# Patient Record
Sex: Male | Born: 1985 | Race: White | Hispanic: No | Marital: Married | Smoking: Never smoker
Health system: Southern US, Community
[De-identification: ages and names within clinical notes are randomized; demographics above are authoritative.]

## PROBLEM LIST (undated history)

## (undated) HISTORY — PX: OTHER SURGICAL HISTORY: SHX169

---

## 2012-07-03 ENCOUNTER — Ambulatory Visit (INDEPENDENT_AMBULATORY_CARE_PROVIDER_SITE_OTHER): Payer: BC Managed Care – PPO | Admitting: Family Medicine

## 2012-07-03 ENCOUNTER — Encounter: Payer: Self-pay | Admitting: Family Medicine

## 2012-07-03 VITALS — BP 124/86 | Temp 97.5°F | Wt 233.8 lb

## 2012-07-03 DIAGNOSIS — L723 Sebaceous cyst: Secondary | ICD-10-CM

## 2012-07-03 MED ORDER — DOXYCYCLINE HYCLATE 100 MG PO TABS: 100.0000 mg | ORAL_TABLET | Freq: Two times a day (BID) | ORAL | Status: DC

## 2012-07-03 NOTE — Patient Instructions (Signed)
Take all the antibiotics 

## 2012-07-05 NOTE — Progress Notes (Signed)
  Subjective:    Patient ID: Miguel Bowman., male    DOB: 1985/10/11, 27 y.o.   MRN: [id number]  HPI Patient presents the office with a bump on the back of his neck. It is concerning him. It has been going on for some time. Probably months. Recently it has started to swell more. His fiance is worried about it. No fever. Had a similar bump, up and was able to express some discharge out of it. A number of months ago.   Review of Systems    ROS no fever no rash no chills no swelling elsewhere otherwise negative Objective:   Physical Exam  Alert no acute distress. Vitals reviewed. Lungs clear. Heart regular rate and rhythm. HEENT normal. Upper posterior cervical region swollen palpable sebaceous cyst. No fluctuance. No erythema. Slightly tender to palpation. Similar bump nearby. But smaller.      Assessment & Plan:  Impression #1 sebaceous cyst with secondary infection-discussed. Natural history of sebaceous cysts discussed. 15 minutes spent most in discussion. Plan Doxy 100 twice a day 10 days. Warning signs discussed. WSL

## 2012-10-19 ENCOUNTER — Encounter: Payer: Self-pay | Admitting: Family Medicine

## 2012-10-19 ENCOUNTER — Ambulatory Visit (INDEPENDENT_AMBULATORY_CARE_PROVIDER_SITE_OTHER): Payer: BC Managed Care – PPO | Admitting: Family Medicine

## 2012-10-19 VITALS — BP 130/86 | Ht 71.0 in | Wt 239.4 lb

## 2012-10-19 DIAGNOSIS — R079 Chest pain, unspecified: Secondary | ICD-10-CM

## 2012-10-19 NOTE — Progress Notes (Signed)
  Subjective:    Patient ID: Miguel Shove., male    DOB: 1985/02/24, 27 y.o.   MRN: [id number]  Chest Pain  This is a new problem. The current episode started in the past 7 days. The onset quality is sudden. The problem occurs intermittently. The problem has been unchanged. The pain is at a severity of 4/10. The pain is mild. The quality of the pain is described as sharp and heavy. The pain radiates to the epigastrium.   Sudden sharp pain, took a couple impressive  Patient concerned about the chest pain. There is some family history of heart disease.  Sharp one shot painsharp  No sig chamge from before  Movement exrcises, no pain with motion  Felt out of breath.  Tumbles rolls etc. Felt a sense of panic, recurred several times   Review of Systems  Cardiovascular: Positive for chest pain.   no abdominal pain no change in bowel habits ROS otherwise negative     Objective:   Physical Exam Alert no acute distress. HEENT normal. Lungs clear. Heart regular rate and rhythm. Chest wall nontender. Abdomen benign.  EKG normal sinus rhythm no significant ST-T changes partial right bundle-branch block which does not contribute to patient's symptoms       Assessment & Plan:  Impression chest pain highly likely chest wall. On further history doing a lot of new exercise prior to this occurrence. Pain is sharp lasts just a few seconds. Normal exam and normal EKG extremely unlikely that this is apart. Discussed at length. Plan 25 minutes spent most in discussion. Aleve twice a day with food when necessary. Continue exercising. WSL

## 2013-02-08 ENCOUNTER — Ambulatory Visit (INDEPENDENT_AMBULATORY_CARE_PROVIDER_SITE_OTHER): Payer: BC Managed Care – PPO | Admitting: Family Medicine

## 2013-02-08 ENCOUNTER — Encounter: Payer: Self-pay | Admitting: Family Medicine

## 2013-02-08 VITALS — BP 132/70 | Temp 98.4°F | Ht 70.0 in | Wt 231.0 lb

## 2013-02-08 DIAGNOSIS — J019 Acute sinusitis, unspecified: Secondary | ICD-10-CM

## 2013-02-08 MED ORDER — LEVOFLOXACIN 500 MG PO TABS: 500.0000 mg | ORAL_TABLET | Freq: Every day | ORAL | Status: AC

## 2013-02-08 NOTE — Progress Notes (Signed)
   Subjective:    Patient ID: Miguel Bowman., male    DOB: 10/31/1985, 28 y.o.   MRN: [id number]  Sore Throat  This is a new problem. The current episode started 1 to 4 weeks ago. Associated symptoms include trouble swallowing. Pertinent negatives include no congestion, coughing or ear pain. Associated symptoms comments: Runny nose.   Started since a few weeks ago PMH benign Some sweats, no chills No cough, some sinus Sx pmh-benign Review of Systems  Constitutional: Negative for fever and activity change.  HENT: Positive for sore throat and trouble swallowing. Negative for congestion, ear pain and rhinorrhea.   Eyes: Negative for discharge.  Respiratory: Negative for cough, choking and wheezing.   Cardiovascular: Negative for chest pain.       Objective:   Physical Exam  Nursing note and vitals reviewed. Constitutional: He appears well-developed.  HENT:  Head: Normocephalic.  Mouth/Throat: Oropharynx is clear and moist. No oropharyngeal exudate.  Neck: Normal range of motion.  Cardiovascular: Normal rate, regular rhythm and normal heart sounds.   No murmur heard. Pulmonary/Chest: Effort normal and breath sounds normal. He has no wheezes.  Lymphadenopathy:    He has no cervical adenopathy.  Neurological: He exhibits normal muscle tone.  Skin: Skin is warm and dry.          Assessment & Plan:  Crestor illness with secondary sinusitis Levaquin 10 days as directed followup if progressive troubles warning signs discussed

## 2013-08-10 ENCOUNTER — Encounter: Payer: Self-pay | Admitting: Family Medicine

## 2013-08-10 ENCOUNTER — Ambulatory Visit (INDEPENDENT_AMBULATORY_CARE_PROVIDER_SITE_OTHER): Payer: BC Managed Care – PPO | Admitting: Family Medicine

## 2013-08-10 VITALS — BP 112/80 | Temp 97.9°F | Ht 70.0 in | Wt 233.4 lb

## 2013-08-10 DIAGNOSIS — R109 Unspecified abdominal pain: Secondary | ICD-10-CM

## 2013-08-10 LAB — POCT URINALYSIS DIPSTICK
Spec Grav, UA: 1.02
pH, UA: 5

## 2013-08-10 NOTE — Progress Notes (Signed)
   Subjective:    Patient ID: Miguel ShoveGeorge T Osso Jr., male    DOB: 08/11/1985, 28 y.o.   MRN: [id number]  Abdominal Pain This is a new problem. The current episode started yesterday. The onset quality is sudden. The problem occurs intermittently. The problem has been gradually worsening. The pain is located in the generalized abdominal region. The pain is moderate. The quality of the pain is cramping and dull. The abdominal pain radiates to the right flank. Associated symptoms include diarrhea and headaches. Nothing aggravates the pain. The pain is relieved by nothing. He has tried acetaminophen for the symptoms. The treatment provided no relief.  started last night, began with central abd cramps Then radiated to the right side Then drank a glass of water because felt dehydrated Light yellow urine no hematuria Had diarrhea times 2 this am, very loose Aching in the right flank Moderate nausea, ? Fever this am, felt chilled Not around any intestinal sickness Not sure of kidney stone history No recent travel or undercooked food Patient states he has no other concerns at this time.    Review of Systems  Gastrointestinal: Positive for abdominal pain and diarrhea.  Neurological: Positive for headaches.       Objective:   Physical Exam  Vitals reviewed. Constitutional: He appears well-nourished. No distress.  Cardiovascular: Normal rate, regular rhythm and normal heart sounds.   No murmur heard. Pulmonary/Chest: Effort normal and breath sounds normal. No respiratory distress.  Abdominal: Soft. He exhibits no distension. There is no tenderness.  There is minimal tenderness in the right mid to right lower quadrant no guarding or rebound  Musculoskeletal: He exhibits no edema.  Lymphadenopathy:    He has no cervical adenopathy.  Neurological: He is alert.  Psychiatric: His behavior is normal.          Assessment & Plan:  His urine is negative tenderness is minimal I doubt  appendicitis. We talked at length about the importance of if his symptoms progress for him to notify us immediately to do further evaluation including the possibility of lab work and scan. More than likely this is viral process I doubt kidney stone

## 2013-09-22 ENCOUNTER — Telehealth: Payer: Self-pay | Admitting: Family Medicine

## 2013-09-22 NOTE — Telephone Encounter (Signed)
Notified patient that according to his chart he has not received the TDAP vaccine. Patient was told to check with his insurance to make sure the vaccine is covered. Patient stated that even if his insurance doesn't cover it, he will pay cash price for it. Patient was transferred to front desk to schedule appointment.

## 2013-09-22 NOTE — Telephone Encounter (Signed)
Mr Miguel Bowman wants to know if he has had a Tdap shot.  Family member has a new baby and they can't be near it if they have not been vaccinated. Please call and let him know if he has had one or needs to come in for one. bb

## 2013-09-23 ENCOUNTER — Ambulatory Visit (INDEPENDENT_AMBULATORY_CARE_PROVIDER_SITE_OTHER): Payer: BC Managed Care – PPO | Admitting: *Deleted

## 2013-09-23 DIAGNOSIS — Z23 Encounter for immunization: Secondary | ICD-10-CM

## 2014-03-21 ENCOUNTER — Encounter: Payer: BC Managed Care – PPO | Admitting: Family Medicine

## 2014-03-29 ENCOUNTER — Ambulatory Visit (INDEPENDENT_AMBULATORY_CARE_PROVIDER_SITE_OTHER): Payer: BC Managed Care – PPO | Admitting: Family Medicine

## 2014-03-29 ENCOUNTER — Encounter: Payer: Self-pay | Admitting: Family Medicine

## 2014-03-29 VITALS — BP 122/82 | Ht 70.0 in | Wt 238.8 lb

## 2014-03-29 DIAGNOSIS — Z0000 Encounter for general adult medical examination without abnormal findings: Secondary | ICD-10-CM

## 2014-03-29 DIAGNOSIS — Z79899 Other long term (current) drug therapy: Secondary | ICD-10-CM

## 2014-03-29 LAB — LIPID PANEL
CHOLESTEROL: 185 mg/dL (ref 0–200)
HDL: 46 mg/dL (ref >=40)
LDL Cholesterol: 106 mg/dL — ABNORMAL HIGH (ref 0–99)
TRIGLYCERIDES: 165 mg/dL — AB (ref <150)
Total CHOL/HDL Ratio: 4 Ratio
VLDL: 33 mg/dL (ref 0–40)

## 2014-03-29 LAB — HEPATIC FUNCTION PANEL
ALBUMIN: 4.7 g/dL (ref 3.5–5.2)
ALT: 21 U/L (ref 0–53)
AST: 18 U/L (ref 0–37)
Alkaline Phosphatase: 56 U/L (ref 39–117)
BILIRUBIN INDIRECT: 0.5 mg/dL (ref 0.2–1.2)
Bilirubin, Direct: 0.1 mg/dL (ref 0.0–0.3)
Total Bilirubin: 0.6 mg/dL (ref 0.2–1.2)
Total Protein: 7.4 g/dL (ref 6.0–8.3)

## 2014-03-29 LAB — BASIC METABOLIC PANEL
BUN: 17 mg/dL (ref 6–23)
CHLORIDE: 106 meq/L (ref 96–112)
CO2: 29 meq/L (ref 19–32)
CREATININE: 0.98 mg/dL (ref 0.50–1.35)
Calcium: 9.6 mg/dL (ref 8.4–10.5)
GLUCOSE: 95 mg/dL (ref 70–99)
POTASSIUM: 4.5 meq/L (ref 3.5–5.3)
Sodium: 143 mEq/L (ref 135–145)

## 2014-03-29 MED ORDER — TERBINAFINE HCL 250 MG PO TABS: 250.0000 mg | ORAL_TABLET | Freq: Every day | ORAL | Status: DC

## 2014-03-29 NOTE — Progress Notes (Signed)
   Subjective:    Patient ID: Miguel ShoveGeorge T Castagna Jr., male    DOB: 01/13/1986, 29 y.o.   MRN: [id number]  HPI Patient arrives for annual PE.  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   Tremendous pace with work day and eve  Very hectic pace  Exercise not the best. 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  Sleeping well at night  Patient also has nail fungus on both feet he would like to discuss treatment.   Review of Systems  Constitutional: Negative for fever, activity change and appetite change.  HENT: Negative for congestion and rhinorrhea.   Eyes: Negative for discharge.  Respiratory: Negative for cough and wheezing.   Cardiovascular: Negative for chest pain.  Gastrointestinal: Negative for vomiting, abdominal pain and blood in stool.  Genitourinary: Negative for frequency and difficulty urinating.  Musculoskeletal: Negative for neck pain.  Skin: Negative for rash.  Allergic/Immunologic: Negative for environmental allergies and food allergies.  Neurological: Negative for weakness and headaches.  Psychiatric/Behavioral: Negative for agitation.  All other systems reviewed and are negative.      Objective:   Physical Exam  Constitutional: He appears well-developed and well-nourished.  HENT:  Head: Normocephalic and atraumatic.  Right Ear: External ear normal.  Left Ear: External ear normal.  Nose: Nose normal.  Mouth/Throat: Oropharynx is clear and moist.  Eyes: EOM are normal. Pupils are equal, round, and reactive to light.  Neck: Normal range of motion. Neck supple. No thyromegaly present.  Cardiovascular: Normal rate, regular rhythm and normal heart sounds.   No murmur heard. Pulmonary/Chest: Effort normal and breath sounds normal. No respiratory distress. He has no wheezes.  Abdominal: Soft. Bowel sounds are normal. He exhibits no distension and no mass. There is no tenderness.  Genitourinary: Penis normal.    Musculoskeletal: Normal range of motion. He exhibits no edema.  Lymphadenopathy:    He has no cervical adenopathy.  Neurological: He is alert. He exhibits normal muscle tone.  Skin: Skin is warm and dry. No erythema.  Very severe involvement of virtually all nails with onychomycosis. Also tenia pedis. Also tenia cruris. KOH positive for fungal elements  Psychiatric: He has a normal mood and affect. His behavior is normal. Judgment normal.          Assessment & Plan:  Impression #1 wellness exam #2 onychomycosis discussed definitely needs to be treated with severity #3 obesity discussed plan appropriate blood work. If liver enzymes negative did initiate Lamisil. Check liver enzymes 6 weeks and. Diet exercise discussed further recommendations based on blood work [facility]

## 2014-03-29 NOTE — Patient Instructions (Signed)
Do not start Lamisil till you hear results of lab work. Once you start the med you will need to have blood work repeated in 6 weeks.

## 2014-03-31 ENCOUNTER — Encounter: Payer: Self-pay | Admitting: Family Medicine

## 2014-04-26 ENCOUNTER — Encounter: Payer: Self-pay | Admitting: *Deleted

## 2016-01-02 ENCOUNTER — Ambulatory Visit (INDEPENDENT_AMBULATORY_CARE_PROVIDER_SITE_OTHER): Payer: BC Managed Care – PPO | Admitting: Family Medicine

## 2016-01-02 ENCOUNTER — Encounter: Payer: Self-pay | Admitting: Family Medicine

## 2016-01-02 DIAGNOSIS — K580 Irritable bowel syndrome with diarrhea: Secondary | ICD-10-CM | POA: Diagnosis not present

## 2016-01-02 MED ORDER — HYOSCYAMINE SULFATE ER 0.375 MG PO TB12: 0.3750 mg | ORAL_TABLET | Freq: Two times a day (BID) | ORAL | 2 refills | Status: DC

## 2016-01-02 NOTE — Progress Notes (Signed)
   Subjective:    Patient ID: Miguel ShoveGeorge T Ridge Jr., male    DOB: 04/16/1985, 30 y.o.   MRN: [id number]  Abdominal Pain  This is a new problem. The current episode started more than 1 month ago. The problem occurs intermittently. The problem has been unchanged. The pain is located in the generalized abdominal region. The quality of the pain is aching. Associated symptoms include diarrhea. Nothing aggravates the pain. The pain is relieved by nothing. Treatments tried: otc meds. The treatment provided mild relief.   Patient states that he has no other concerns at this time.   Mid October Developed stomach symtoms after eating old cake  Had diarrhea, loose stools for awhile Start of nov had white  bm's   Now loose stools again   Has tried low carb diet, lost eight pounds on a low carb diet  Worse now  Pain     No longer getting better, Ongoing loose stools. Ongoing cramping. Admits to considerable increased stress. Recently had a baby. Though he and his wife are of course happy about this a lot of stress at home. 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 Next  Strong family history of IBS this is discussed at length.  Review of Systems  Gastrointestinal: Positive for abdominal pain and diarrhea.       Objective:   Physical Exam  Alert vitals stable, NAD. Blood pressure good on repeat. HEENT normal. Lungs clear. Heart regular rate and rhythm. Abdomen excellent bowel sounds no discrete tenderness no organomegaly no rebound no guarding very mild diffuse sensitivity      Assessment & Plan:  Impression probable IBS discussed at great length highly doubt anything serious such as cancer , inflammatory bowel disease, protracted infection etc. plan educational information given. Multiple questions answered. Increase fiber intake. Start left did one twice per day. Await response warning signs discussed 25 minutes spent most in discussion

## 2016-01-02 NOTE — Patient Instructions (Signed)
Irritable Bowel Syndrome, Adult Irritable bowel syndrome (IBS) is not one specific disease. It is a group of symptoms that affects the organs responsible for digestion (gastrointestinal or GI tract). To regulate how your GI tract works, your body sends signals back and forth between your intestines and your brain. If you have IBS, there may be a problem with these signals. As a result, your GI tract does not function normally. Your intestines may become more sensitive and overreact to certain things. This is especially true when you eat certain foods or when you are under stress. There are four types of IBS. These may be determined based on the consistency of your stool:  IBS with diarrhea.  IBS with constipation.  Mixed IBS.  Unsubtyped IBS. It is important to know which type of IBS you have. Some treatments are more likely to be helpful for certain types of IBS. What are the causes? The exact cause of IBS is not known. What increases the risk? You may have a higher risk of IBS if:  You are a woman.  You are younger than 30 years old.  You have a family history of IBS.  You have mental health problems.  You have had bacterial infection of your GI tract. What are the signs or symptoms? Symptoms of IBS vary from person to person. The main symptom is abdominal pain or discomfort. Additional symptoms usually include one or more of the following:  Diarrhea, constipation, or both.  Abdominal swelling or bloating.  Feeling full or sick after eating a small or regular-size meal.  Frequent gas.  Mucus in the stool.  A feeling of having more stool left after a bowel movement. Symptoms tend to come and go. They may be associated with stress, psychiatric conditions, or nothing at all. How is this diagnosed? There is no specific test to diagnose IBS. Your health care provider will make a diagnosis based on a physical exam, medical history, and your symptoms. You may have other tests to  rule out other conditions that may be causing your symptoms. These may include:  Blood tests.  X-rays.  CT scan.  Endoscopy and colonoscopy. This is a test in which your GI tract is viewed with a long, thin, flexible tube. How is this treated? There is no cure for IBS, but treatment can help relieve symptoms. IBS treatment often includes:  Changes to your diet, such as:  Eating more fiber.  Avoiding foods that cause symptoms.  Drinking more water.  Eating regular, medium-sized portioned meals.  Medicines. These may include:  Fiber supplements if you have constipation.  Medicine to control diarrhea (antidiarrheal medicines).  Medicine to help control muscle spasms in your GI tract (antispasmodic medicines).  Medicines to help with any mental health issues, such as antidepressants or tranquilizers.  Therapy.  Talk therapy may help with anxiety, depression, or other mental health issues that can make IBS symptoms worse.  Stress reduction.  Managing your stress can help keep symptoms under control. Follow these instructions at home:  Take medicines only as directed by your health care provider.  Eat a healthy diet.  Avoid foods and drinks with added sugar.  Include more whole grains, fruits, and vegetables gradually into your diet. This may be especially helpful if you have IBS with constipation.  Avoid any foods and drinks that make your symptoms worse. These may include dairy products and caffeinated or carbonated drinks.  Do not eat large meals.  Drink enough fluid to keep your urine   clear or pale yellow.  Exercise regularly. Ask your health care provider for recommendations of good activities for you.  Keep all follow-up visits as directed by your health care provider. This is important. Contact a health care provider if:  You have constant pain.  You have trouble or pain with swallowing.  You have worsening diarrhea. Get help right away if:  You  have severe and worsening abdominal pain.  You have diarrhea and:  You have a rash, stiff neck, or severe headache.  You are irritable, sleepy, or difficult to awaken.  You are weak, dizzy, or extremely thirsty.  You have bright red blood in your stool or you have black tarry stools.  You have unusual abdominal swelling that is painful.  You vomit continuously.  You vomit blood (hematemesis).  You have both abdominal pain and a fever. This information is not intended to replace advice given to you by your health care provider. Make sure you discuss any questions you have with your health care provider. Document Released: 01/21/2005 Document Revised: 06/23/2015 Document Reviewed: 10/08/2013 Elsevier Interactive Patient Education  2017 Else[employer] Inc.  

## 2016-01-03 DIAGNOSIS — K589 Irritable bowel syndrome without diarrhea: Secondary | ICD-10-CM | POA: Insufficient documentation

## 2016-02-08 ENCOUNTER — Telehealth: Payer: Self-pay | Admitting: Family Medicine

## 2016-02-08 ENCOUNTER — Encounter: Payer: Self-pay | Admitting: Family Medicine

## 2016-02-08 DIAGNOSIS — K580 Irritable bowel syndrome with diarrhea: Secondary | ICD-10-CM

## 2016-02-08 NOTE — Telephone Encounter (Signed)
Referral entered  

## 2016-02-08 NOTE — Telephone Encounter (Signed)
Let's do 

## 2016-02-08 NOTE — Telephone Encounter (Signed)
Been on meds for the IBS since 01/03/16, can't tell it's helping very much Helped some with the cramping but not actually going  Would like referral to Dr. Luvenia Starchourk's office   Please advise

## 2016-02-14 ENCOUNTER — Encounter: Payer: Self-pay | Admitting: Internal Medicine

## 2016-03-07 ENCOUNTER — Encounter: Payer: Self-pay | Admitting: Gastroenterology

## 2016-03-07 ENCOUNTER — Ambulatory Visit (INDEPENDENT_AMBULATORY_CARE_PROVIDER_SITE_OTHER): Payer: BC Managed Care – PPO | Admitting: Gastroenterology

## 2016-03-07 DIAGNOSIS — R194 Change in bowel habit: Secondary | ICD-10-CM | POA: Insufficient documentation

## 2016-03-07 NOTE — Progress Notes (Signed)
Primary Care Physician:  Lubertha SouthSteve Luking, MD  Primary Gastroenterologist:  Roetta SessionsMichael Rourk, MD   Chief Complaint  Patient presents with  . Constipation  . Diarrhea    with white discharge    HPI:  Miguel ShoveGeorge T Luepke Jr. is a 31 y.o. male here to request a PCP for irritable bowel syndrome with diarrhea. Patient notes he started having a bowel habit changes around October of last year. He had a baby in September, first child. 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 Previously states his bowel habits are pretty normal. He had 1 stool a day. Rarely had any issues with urgency only once or twice before. In October he started having a lot of abdominal cramping and urgency. He thought might be because of dietary changes, walls wife is pregnant and they were both on low-carb diet. After the baby was born he went back to her regular diet. He therefore try low-carb diet again however was no significant improvement in his bowel issues.  Patient saw PCP. He was given trial of Levbid which did help with cramping but did not help with stool consistency. Patient states he never tried fiber as suggested. He went and bought MiraLAX and noted improvement in his stool consistency, more regularity and less mucus discharge noted. Had reported lots of white discharge almost more than stool at one point. Denies rectal pain. No upper GI symptoms. Notes that if he stops MiraLAX and he starts having more issues with urgency abdominal cramping and mucousy discharge. Denies bright red blood per rectum or melena. Patient reports starting MiraLAX because he was having some hard stool, now mostly [place] 4-5.      Current Outpatient Prescriptions  Medication Sig Dispense Refill  . polyethylene glycol (MIRALAX / GLYCOLAX) packet Take 17 g by mouth daily.     No current facility-administered medications for  this visit.     Allergies as of 03/07/2016 - Review Complete 03/07/2016  Allergen Reaction Noted  . Ceclor [cefaclor]  04/26/2014    History reviewed. No pertinent past medical history.  Past Surgical History:  Procedure Laterality Date  . none      Family History  Problem Relation Age of Onset  . Colon cancer Neg Hx   . Inflammatory bowel disease Neg Hx   . Celiac disease Neg Hx     Social History   Social History  . Marital status: Married    Spouse name: N/A  . Number of children: N/A  . Years of education: N/A   Occupational History  . 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 Smoking status: Never Smoker  . Smokeless tobacco: Never Used  . Alcohol use No  . Drug use: No  . Sexual activity: Not on file   Other Topics Concern  . Not on file   Social History Narrative  . No narrative on file      ROS:  General: Negative for anorexia, weight loss, fever, chills, fatigue, weakness. Eyes: Negative for vision changes.  ENT: Negative for hoarseness, difficulty swallowing , nasal congestion. CV: Negative for chest pain, angina, palpitations, dyspnea on exertion, peripheral edema.  Respiratory: Negative for dyspnea at rest, dyspnea on exertion, cough, sputum, wheezing.  GI: See history of present illness. GU:  Negative for dysuria, hematuria, urinary incontinence,  urinary frequency, nocturnal urination.  MS: Negative for joint pain, low back pain.  Derm: Negative for rash or itching.  Neuro: Negative for weakness, abnormal sensation, seizure, frequent headaches, memory loss, confusion.  Psych: Negative for anxiety, depression, suicidal ideation, hallucinations.  Endo: Negative for unusual weight change.  Heme: Negative for bruising or bleeding. Allergy: Negative for rash or hives.    Physical Examination:  BP 140/76   Pulse 82   Temp 97.6 F (36.4 C) (Oral)   Ht 5' 10.5" (1.791 m)   Wt 245 lb 6.4 oz (111.3 kg)    BMI 34.71 kg/m    General: Well-nourished, well-developed in no acute distress.  Head: Normocephalic, atraumatic.   Eyes: Conjunctiva pink, no icterus. Mouth: Oropharyngeal mucosa moist and pink , no lesions erythema or exudate. Neck: Supple without thyromegaly, masses, or lymphadenopathy.  Lungs: Clear to auscultation bilaterally.  Heart: Regular rate and rhythm, no murmurs rubs or gallops.  Abdomen: Bowel sounds are normal, nontender, nondistended, no hepatosplenomegaly or masses, no abdominal bruits or    hernia , no rebound or guarding.   Rectal: not performed Extremities: No lower extremity edema. No clubbing or deformities.  Neuro: Alert and oriented x 4 , grossly normal neurologically.  Skin: Warm and dry, no rash or jaundice.   Psych: Alert and cooperative, normal mood and affect.    Imaging Studies: No results found.

## 2016-03-07 NOTE — Patient Instructions (Signed)
1. Please have your labs done. We will contact you with results and further recommendations.  2. Continue miralax once daily as needed to maintain soft bowel movement.  3. I suspect your symptoms are secondary to IBS but we will exclude other potential etiologies as discussed.   Irritable Bowel Syndrome, Adult Irritable bowel syndrome (IBS) is not one specific disease. It is a group of symptoms that affects the organs responsible for digestion (gastrointestinal or GI tract). To regulate how your GI tract works, your body sends signals back and forth between your intestines and your brain. If you have IBS, there may be a problem with these signals. As a result, your GI tract does not function normally. Your intestines may become more sensitive and overreact to certain things. This is especially true when you eat certain foods or when you are under stress. There are four types of IBS. These may be determined based on the consistency of your stool:  IBS with diarrhea.  IBS with constipation.  Mixed IBS.  Unsubtyped IBS. It is important to know which type of IBS you have. Some treatments are more likely to be helpful for certain types of IBS. What are the causes? The exact cause of IBS is not known. What increases the risk? You may have a higher risk of IBS if:  You are a woman.  You are younger than 31 years old.  You have a family history of IBS.  You have mental health problems.  You have had bacterial infection of your GI tract. What are the signs or symptoms? Symptoms of IBS vary from person to person. The main symptom is abdominal pain or discomfort. Additional symptoms usually include one or more of the following:  Diarrhea, constipation, or both.  Abdominal swelling or bloating.  Feeling full or sick after eating a small or regular-size meal.  Frequent gas.  Mucus in the stool.  A feeling of having more stool left after a bowel movement. Symptoms tend to come and go.  They may be associated with stress, psychiatric conditions, or nothing at all. How is this diagnosed? There is no specific test to diagnose IBS. Your health care provider will make a diagnosis based on a physical exam, medical history, and your symptoms. You may have other tests to rule out other conditions that may be causing your symptoms. These may include:  Blood tests.  X-rays.  CT scan.  Endoscopy and colonoscopy. This is a test in which your GI tract is viewed with a long, thin, flexible tube. How is this treated? There is no cure for IBS, but treatment can help relieve symptoms. IBS treatment often includes:  Changes to your diet, such as:  Eating more fiber.  Avoiding foods that cause symptoms.  Drinking more water.  Eating regular, medium-sized portioned meals.  Medicines. These may include:  Fiber supplements if you have constipation.  Medicine to control diarrhea (antidiarrheal medicines).  Medicine to help control muscle spasms in your GI tract (antispasmodic medicines).  Medicines to help with any mental health issues, such as antidepressants or tranquilizers.  Therapy.  Talk therapy may help with anxiety, depression, or other mental health issues that can make IBS symptoms worse.  Stress reduction.  Managing your stress can help keep symptoms under control. Follow these instructions at home:  Take medicines only as directed by your health care provider.  Eat a healthy diet.  Avoid foods and drinks with added sugar.  Include more whole grains, fruits, and vegetables gradually  into your diet. This may be especially helpful if you have IBS with constipation.  Avoid any foods and drinks that make your symptoms worse. These may include dairy products and caffeinated or carbonated drinks.  Do not eat large meals.  Drink enough fluid to keep your urine clear or pale yellow.  Exercise regularly. Ask your health care provider for recommendations of good  activities for you.  Keep all follow-up visits as directed by your health care provider. This is important. Contact a health care provider if:  You have constant pain.  You have trouble or pain with swallowing.  You have worsening diarrhea. Get help right away if:  You have severe and worsening abdominal pain.  You have diarrhea and:  You have a rash, stiff neck, or severe headache.  You are irritable, sleepy, or difficult to awaken.  You are weak, dizzy, or extremely thirsty.  You have bright red blood in your stool or you have black tarry stools.  You have unusual abdominal swelling that is painful.  You vomit continuously.  You vomit blood (hematemesis).  You have both abdominal pain and a fever. This information is not intended to replace advice given to you by your health care provider. Make sure you discuss any questions you have with your health care provider. Document Released: 01/21/2005 Document Revised: 06/23/2015 Document Reviewed: 10/08/2013 Elsevier Interactive Patient Education  2017 Elsevier Inc.   Diet for Irritable Bowel Syndrome Introduction When you have irritable bowel syndrome (IBS), the foods you eat and your eating habits are very important. IBS may cause various symptoms, such as abdominal pain, constipation, or diarrhea. Choosing the right foods can help ease discomfort caused by these symptoms. Work with your health care provider and dietitian to find the best eating plan to help control your symptoms. What general guidelines do I need to follow?  Keep a food diary. This will help you identify foods that cause symptoms. Write down:  What you eat and when.  What symptoms you have.  When symptoms occur in relation to your meals.  Avoid foods that cause symptoms. Talk with your dietitian about other ways to get the same nutrients that are in these foods.  Eat more foods that contain fiber. Take a fiber supplement if directed by your  dietitian.  Eat your meals slowly, in a relaxed setting.  Aim to eat 5-6 small meals per day. Do not skip meals.  Drink enough fluids to keep your urine clear or pale yellow.  Ask your health care provider if you should take an over-the-counter probiotic during flare-ups to help restore healthy gut bacteria.  If you have cramping or diarrhea, try making your meals low in fat and high in carbohydrates. Examples of carbohydrates are pasta, rice, whole grain breads and cereals, fruits, and vegetables.  If dairy products cause your symptoms to flare up, try eating less of them. You might be able to handle yogurt better than other dairy products because it contains bacteria that help with digestion. What foods are not recommended? The following are some foods and drinks that may worsen your symptoms:  Fatty foods, such as [place] fries.  Milk products, such as cheese or ice cream.  Chocolate.  Alcohol.  Products with caffeine, such as coffee.  Carbonated drinks, such as soda. The items listed above may not be a complete list of foods and beverages to avoid. Contact your dietitian for more information.  What foods are good sources of fiber? Your health care provider  or dietitian may recommend that you eat more foods that contain fiber. Fiber can help reduce constipation and other IBS symptoms. Add foods with fiber to your diet a little at a time so that your body can get used to them. Too much fiber at once might cause gas and swelling of your abdomen. The following are some foods that are good sources of fiber:  Apples.  Peaches.  Pears.  Berries.  Figs.  Broccoli (raw).  Cabbage.  Carrots.  Raw peas.  Kidney beans.  Lima beans.  Whole grain bread.  Whole grain cereal. Where to find more information: [employer] for Functional Gastrointestinal Disorders: www.iffgd.[employer] of Diabetes and Digestive and Kidney Diseases:  http://norris-lawson.com/.aspx This information is not intended to replace advice given to you by your health care provider. Make sure you discuss any questions you have with your health care provider. Document Released: 04/13/2003 Document Revised: 06/29/2015 Document Reviewed: 04/23/2013  2017 Elsevier

## 2016-03-07 NOTE — Assessment & Plan Note (Signed)
31 year old gentleman with history of change in bowel habits beginning around September last year. Patient reports increased stress with new baby, new job. Developed abdominal cramping, fecal urgency, white mucousy discharge which was concerning to him. Cramping responded and a spasmodic. His bowel habits actually improved with MiraLAX which is interesting given his urgency previously. He did report intermittent constipation. I'm almost certain that he has irritable bowel syndrome. We discussed at length today. I did offer limited workup to exclude celiac, thyroid dysfunction, screen for inflammatory component. He will continue MiraLAX daily for now. Hold for diarrhea. Further recommendations to follow.

## 2016-03-08 NOTE — Progress Notes (Signed)
cc'ed to pcp °

## 2016-03-10 LAB — CBC WITH DIFFERENTIAL/PLATELET
BASOS: 0 %
Basophils Absolute: 0 10*3/uL (ref 0.0–0.2)
EOS (ABSOLUTE): 0.1 10*3/uL (ref 0.0–0.4)
EOS: 1 %
HEMATOCRIT: 44.2 % (ref 37.5–51.0)
Hemoglobin: 14.9 g/dL (ref 13.0–17.7)
Immature Grans (Abs): 0 10*3/uL (ref 0.0–0.1)
Immature Granulocytes: 0 %
Lymphocytes Absolute: 1.6 10*3/uL (ref 0.7–3.1)
Lymphs: 24 %
MCH: 27.5 pg (ref 26.6–33.0)
MCHC: 33.7 g/dL (ref 31.5–35.7)
MCV: 82 fL (ref 79–97)
MONOS ABS: 0.6 10*3/uL (ref 0.1–0.9)
Monocytes: 9 %
Neutrophils Absolute: 4.4 10*3/uL (ref 1.4–7.0)
Neutrophils: 66 %
Platelets: 362 10*3/uL (ref 150–379)
RBC: 5.41 x10E6/uL (ref 4.14–5.80)
RDW: 14 % (ref 12.3–15.4)
WBC: 6.7 10*3/uL (ref 3.4–10.8)

## 2016-03-10 LAB — COMPREHENSIVE METABOLIC PANEL
A/G RATIO: 2 (ref 1.2–2.2)
ALT: 27 IU/L (ref 0–44)
AST: 24 IU/L (ref 0–40)
Albumin: 4.5 g/dL (ref 3.5–5.5)
Alkaline Phosphatase: 64 IU/L (ref 39–117)
BUN/Creatinine Ratio: 18 (ref 9–20)
BUN: 18 mg/dL (ref 6–20)
Bilirubin Total: 0.3 mg/dL (ref 0.0–1.2)
CO2: 23 mmol/L (ref 18–29)
Calcium: 9.7 mg/dL (ref 8.7–10.2)
Chloride: 102 mmol/L (ref 96–106)
Creatinine, Ser: 1.01 mg/dL (ref 0.76–1.27)
GFR calc Af Amer: 115 mL/min/{1.73_m2} (ref >59)
GFR, EST NON AFRICAN AMERICAN: 99 mL/min/{1.73_m2} (ref >59)
GLOBULIN, TOTAL: 2.3 g/dL (ref 1.5–4.5)
Glucose: 78 mg/dL (ref 65–99)
POTASSIUM: 5.1 mmol/L (ref 3.5–5.2)
Sodium: 145 mmol/L — ABNORMAL HIGH (ref 134–144)
Total Protein: 6.8 g/dL (ref 6.0–8.5)

## 2016-03-10 LAB — IGA: IgA/Immunoglobulin A, Serum: 104 mg/dL (ref 90–386)

## 2016-03-10 LAB — TSH: TSH: 2.27 u[IU]/mL (ref 0.450–4.500)

## 2016-03-10 LAB — TISSUE TRANSGLUTAMINASE, IGA: Transglutaminase IgA: <2 U/mL (ref 0–3)

## 2016-03-17 NOTE — Progress Notes (Signed)
Please let patient know his celiac screen was negative. His thyroid, kidney, liver function are good.   Dr. Jena Gaussourk advises checking stool ifobt.  Continue miralax as before.  Return ov with rmr only 6 weeks.

## 2016-03-20 ENCOUNTER — Encounter: Payer: Self-pay | Admitting: Internal Medicine

## 2016-03-20 NOTE — Progress Notes (Signed)
APPT MADE AND LETTER SENT  °

## 2016-04-11 ENCOUNTER — Ambulatory Visit (INDEPENDENT_AMBULATORY_CARE_PROVIDER_SITE_OTHER): Payer: BC Managed Care – PPO

## 2016-04-11 DIAGNOSIS — R194 Change in bowel habit: Secondary | ICD-10-CM | POA: Diagnosis not present

## 2016-04-11 LAB — IFOBT (OCCULT BLOOD): IFOBT: NEGATIVE

## 2016-04-16 NOTE — Progress Notes (Signed)
ifobt negative. Keep ov with rmr as planned.

## 2016-04-18 NOTE — Progress Notes (Signed)
Pt is aware.  

## 2016-04-30 ENCOUNTER — Encounter: Payer: Self-pay | Admitting: Internal Medicine

## 2016-04-30 ENCOUNTER — Ambulatory Visit (INDEPENDENT_AMBULATORY_CARE_PROVIDER_SITE_OTHER): Payer: BC Managed Care – PPO | Admitting: Internal Medicine

## 2016-04-30 VITALS — BP 129/80 | HR 78 | Temp 97.5°F | Ht 70.5 in | Wt 238.6 lb

## 2016-04-30 DIAGNOSIS — K582 Mixed irritable bowel syndrome: Secondary | ICD-10-CM | POA: Diagnosis not present

## 2016-04-30 NOTE — Patient Instructions (Signed)
Begin benefiber 1 teaspoon twice daily; Increase by one teaspoon every 2 weeks - goal is 1 tablespoon twice daily  Hold off on Lev-BID for now  Keep a stool diary  Telephone follow-up in 4 weeks  Office visit in 3 months

## 2016-04-30 NOTE — Progress Notes (Signed)
Primary Care Physician:  Lubertha SouthSteve Luking, MD Primary Gastroenterologist:  Dr. Jena Gaussourk  Pre-Procedure History & Physical: HPI:  Miguel ShoveGeorge T Figge Jr. is a 31 y.o. male here for chronically altered bowel habits. Seen here last month. Historically, has had more postprandial diarrhea than not. Over the past several months,  symptoms have reverted to more constipation to the point she has used MiraLAX. Over the past month or so symptoms, have leveled out; has 1-2 bowel movements daily. Urgency has also settled down. He has not passed any blood. More recently, tends to have firm, [place] 4-5 stools. Not taking Levbid currently. He has not had any bleeding. Fecal occult blood test negative. Celiac screen negative. No family history of colon cancer.  No past medical history on file.  Past Surgical History:  Procedure Laterality Date  . none      Prior to Admission medications   Medication Sig Start Date End Date Taking? Authorizing Provider  polyethylene glycol (MIRALAX / GLYCOLAX) packet Take 17 g by mouth daily.    Historical Provider, MD    Allergies as of 04/30/2016 - Review Complete 04/30/2016  Allergen Reaction Noted  . Ceclor [cefaclor]  04/26/2014    Family History  Problem Relation Age of Onset  . Colon cancer Neg Hx   . Inflammatory bowel disease Neg Hx   . Celiac disease Neg Hx     Social History   Social History  . Marital status: Married    Spouse name: N/A  . Number of children: N/A  . Years of education: N/A   Occupational History  . 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 Smoking status: Never Smoker  . Smokeless tobacco: Never Used  . Alcohol use No  . Drug use: No  . Sexual activity: Not on file   Other Topics Concern  . Not on file   Social History Narrative  . No narrative on file    Review of Systems: See HPI, otherwise negative ROS  Physical Exam: BP 129/80   Pulse 78   Temp 97.5 F (36.4 C) (Oral)   Ht  5' 10.5" (1.791 m)   Wt 238 lb 9.6 oz (108.2 kg)   BMI 33.75 kg/m  General:   Alert,  Well-developed, well-nourished, pleasant and cooperative in NAD Mouth:  No deformity or lesions. Neck:  Supple; no masses or thyromegaly. No significant cervical adenopathy. Lungs:  Clear throughout to auscultation.   No wheezes, crackles, or rhonchi. No acute distress. Heart:  Regular rate and rhythm; no murmurs, clicks, rubs,  or gallops. Abdomen: Non-distended, normal bowel sounds.  Soft and nontender without appreciable mass or hepatosplenomegaly.  Pulses:  Normal pulses noted. Extremities:  Without clubbing or edema.  Impression:  Pleasant 31 year old gentleman with alternating constipation and diarrhea most consistent with irritable bowel syndrome. Currently, his bowel symptoms have actually settled down. He is not on a fiber supplement.  There are no alarm symptoms.  I discussed the benign but chronic nature of irritable bowel syndrome. Exacerbations and remissions can be expected. Our goal of treatment is to have good days far out number bad days.  Recommendations:  Begin benefiber 1 teaspoon twice daily; Increase by one teaspoon every 2 weeks - goal is 1 tablespoon twice daily  Hold off on Lev-BID for now  Keep a stool diary  Telephone follow-up in 4 weeks  Office visit in 3 months        Notice: This  dictation was prepared with Dragon dictation along with smaller phrase technology. Any transcriptional errors that result from this process are unintentional and may not be corrected upon review.

## 2016-06-10 ENCOUNTER — Encounter: Payer: Self-pay | Admitting: Internal Medicine

## 2018-01-26 ENCOUNTER — Encounter: Payer: Self-pay | Admitting: Family Medicine

## 2018-01-26 ENCOUNTER — Ambulatory Visit: Payer: BC Managed Care – PPO | Admitting: Family Medicine

## 2018-01-26 VITALS — BP 120/80 | Temp 98.4°F | Ht 70.0 in | Wt 241.0 lb

## 2018-01-26 DIAGNOSIS — L0390 Cellulitis, unspecified: Secondary | ICD-10-CM | POA: Diagnosis not present

## 2018-01-26 MED ORDER — DOXYCYCLINE HYCLATE 100 MG PO TABS: 100.0000 mg | ORAL_TABLET | Freq: Two times a day (BID) | ORAL | 0 refills | Status: DC

## 2018-01-26 NOTE — Progress Notes (Signed)
   Subjective:    Patient ID: Viann ShoveGeorge T Largent Jr., male    DOB: 09/28/1985, 32 y.o.   MRN: [id number]  HPI Patient is here today wanting you to look at a spot on his penis. He would not go into any more detail.   Pt has noted some challenges  Pt had sudden onset of discomfort   Pt noted irritation to skin   Messed with it and puled out something    Maybe some pus in it   In midst of having   A baby  Has noticed a recurrence of similar as to before, pt tried to dran the area, but was unsuccessful , pt noted some relief with positional change, notes some tenderness          Review of Systems No headache, no major weight loss or weight gain, no chest pain no back pain abdominal pain no change in bowel habits complete ROS otherwise negative     Objective:   Physical Exam  Alert vitals stable, NAD. Blood pressure good on repeat. HEENT normal. Lungs clear. Heart regular rate and rhythm. Cystic region on dorsum of penis now secondarily infected  Impression skin structure infection plan doxycycline twice daily 10 days.  Local measures discussed.  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  Offered referral to urology to get it out patient to think about      Assessment & Plan:

## 2019-03-08 ENCOUNTER — Encounter: Payer: Self-pay | Admitting: Family Medicine

## 2019-04-11 ENCOUNTER — Ambulatory Visit: Payer: BC Managed Care – PPO | Attending: Internal Medicine

## 2019-04-11 DIAGNOSIS — Z23 Encounter for immunization: Secondary | ICD-10-CM | POA: Insufficient documentation

## 2019-04-11 NOTE — Progress Notes (Signed)
   [id number]  Name:  Miguel Bowman.    MRN: [id number] DOB: 05/23/1985  04/11/2019  Miguel Bowman was observed post Covid-19 immunization for 15 minutes without incident. He was provided with Vaccine Information Sheet and instruction to access the V-Safe system.   Miguel Bowman was instructed to call 911 with any severe reactions post vaccine: Marland Kitchen Difficulty breathing  . Swelling of face and throat  . A fast heartbeat  . A bad rash all over body  . Dizziness and weakness   Immunizations Administered    Name Date Dose VIS Date Route   Pfizer COVID-19 Vaccine 04/11/2019 12:31 PM 0.3 mL 01/15/2019 Intramuscular   Manufacturer: [employer], [employer]   Lot: [id number]   NDC: [address]-5

## 2019-05-02 ENCOUNTER — Ambulatory Visit: Payer: BC Managed Care – PPO | Attending: Internal Medicine

## 2019-05-02 DIAGNOSIS — Z23 Encounter for immunization: Secondary | ICD-10-CM

## 2019-05-02 NOTE — Progress Notes (Signed)
   [id number]  Name:  Miguel Bowman.    MRN: [id number] DOB: 11-02-1985  05/02/2019  Mr. Miguel Bowman was observed post Covid-19 immunization for 15 minutes without incident. He was provided with Vaccine Information Sheet and instruction to access the V-Safe system.   Mr. Miguel Bowman was instructed to call 911 with any severe reactions post vaccine: Marland Kitchen Difficulty breathing  . Swelling of face and throat  . A fast heartbeat  . A bad rash all over body  . Dizziness and weakness   Immunizations Administered    Name Date Dose VIS Date Route   Pfizer COVID-19 Vaccine 05/02/2019 11:19 AM 0.3 mL 01/15/2019 Intramuscular   Manufacturer: [employer], [employer]   Lot: [id number]   NDC: [address]-9

## 2019-08-26 ENCOUNTER — Ambulatory Visit: Payer: BC Managed Care – PPO | Admitting: Family Medicine

## 2019-08-26 ENCOUNTER — Encounter: Payer: Self-pay | Admitting: Family Medicine

## 2019-08-26 ENCOUNTER — Other Ambulatory Visit: Payer: Self-pay

## 2019-08-26 VITALS — BP 120/72 | HR 92 | Temp 97.8°F | Ht 70.0 in | Wt 238.6 lb

## 2019-08-26 DIAGNOSIS — B353 Tinea pedis: Secondary | ICD-10-CM | POA: Diagnosis not present

## 2019-08-26 MED ORDER — TERBINAFINE HCL 250 MG PO TABS: 250.0000 mg | ORAL_TABLET | Freq: Every day | ORAL | 1 refills | Status: DC

## 2019-08-26 NOTE — Patient Instructions (Signed)
Labs in 6 wks, non fasting.

## 2019-08-26 NOTE — Progress Notes (Signed)
   Patient ID: Miguel Bowman., male    DOB: 24-Dec-1985, 34 y.o.   MRN: [id number]   Chief Complaint  Patient presents with  . foot fungus    patient would like to restart meds for foot fungus-patient was on a pill in the past that worked well   Subjective:    HPI Had fungal infection on bilateral, right foot worse than left.  Was oral tablet and wanting to re-try this. Flaking, scaly, worse on rt foot. Was seen in 2018 for this. Noticing it again in 2019, and unable to come in due to covid. No issues with elevated liver enzymes.   Medical History Miguel Bowman has no past medical history on file.   Outpatient Encounter Medications as of 08/26/2019  Medication Sig  . doxycycline (VIBRA-TABS) 100 MG tablet Take 1 tablet (100 mg total) by mouth 2 (two) times daily. (Patient not taking: Reported on 08/26/2019)  . polyethylene glycol (MIRALAX / GLYCOLAX) packet Take 17 g by mouth daily. (Patient not taking: Reported on 08/26/2019)  . terbinafine (LAMISIL) 250 MG tablet Take 1 tablet (250 mg total) by mouth daily.   No facility-administered encounter medications on file as of 08/26/2019.     Review of Systems  Constitutional: Negative for chills and fever.  HENT: Negative for congestion, rhinorrhea and sore throat.   Respiratory: Negative for cough, shortness of breath and wheezing.   Cardiovascular: Negative for chest pain and leg swelling.  Gastrointestinal: Negative for abdominal pain, diarrhea, nausea and vomiting.  Genitourinary: Negative for dysuria and frequency.  Skin: Negative for rash.       +flaky, scaly, itching skin on bilateral feet.  thickened nails on rt foot.  Neurological: Negative for dizziness, weakness and headaches.     Vitals BP 120/72   Pulse 92   Temp 97.8 F (36.6 C) (Oral)   Ht 5\' 10"  (1.778 m)   Wt (!) 238 lb 9.6 oz (108.2 kg)   SpO2 97%   BMI 34.24 kg/m   Objective:   Physical Exam Vitals and nursing note reviewed.  Constitutional:       General: He is not in acute distress.    Appearance: Normal appearance.  Musculoskeletal:        General: Normal range of motion.  Skin:    General: Skin is warm and dry.     Findings: No rash.     Comments: + rt plantar surface with flaking skin diffusely on foot, and thickened and yellow nails on right foot.   Neurological:     General: No focal deficit present.     Mental Status: He is alert and oriented to person, place, and time.      Assessment and Plan   1. Tinea pedis of both feet - terbinafine (LAMISIL) 250 MG tablet; Take 1 tablet (250 mg total) by mouth daily.  Dispense: 30 tablet; Refill: 1 - Comprehensive Metabolic Panel (CMET)    F/u 6 wk for recheck.

## 2019-10-07 ENCOUNTER — Other Ambulatory Visit: Payer: Self-pay

## 2019-10-07 ENCOUNTER — Telehealth: Payer: Self-pay | Admitting: *Deleted

## 2019-10-07 ENCOUNTER — Ambulatory Visit: Payer: BC Managed Care – PPO | Admitting: Family Medicine

## 2019-10-07 ENCOUNTER — Ambulatory Visit (HOSPITAL_COMMUNITY)
Admission: RE | Admit: 2019-10-07 | Discharge: 2019-10-07 | Disposition: A | Discharge status: Home / Self Care | Payer: BC Managed Care – PPO | Source: Ambulatory Visit | Attending: Family Medicine | Admitting: Family Medicine

## 2019-10-07 ENCOUNTER — Encounter: Payer: Self-pay | Admitting: Family Medicine

## 2019-10-07 VITALS — BP 110/74 | HR 91 | Temp 97.3°F | Ht 70.0 in | Wt 236.0 lb

## 2019-10-07 DIAGNOSIS — R300 Dysuria: Secondary | ICD-10-CM | POA: Diagnosis not present

## 2019-10-07 DIAGNOSIS — R361 Hematospermia: Secondary | ICD-10-CM | POA: Diagnosis not present

## 2019-10-07 DIAGNOSIS — B356 Tinea cruris: Secondary | ICD-10-CM | POA: Diagnosis not present

## 2019-10-07 DIAGNOSIS — N50812 Left testicular pain: Secondary | ICD-10-CM | POA: Insufficient documentation

## 2019-10-07 DIAGNOSIS — B353 Tinea pedis: Secondary | ICD-10-CM

## 2019-10-07 LAB — POCT URINALYSIS DIPSTICK
Spec Grav, UA: >=1.03 — AB (ref 1.010–1.025)
pH, UA: 5 (ref 5.0–8.0)

## 2019-10-07 MED ORDER — TERBINAFINE HCL 1 % EX CREA
1.0000 | TOPICAL_CREAM | Freq: Two times a day (BID) | CUTANEOUS | 0 refills | Status: DC
Start: 2019-10-07 — End: 2020-05-29

## 2019-10-07 NOTE — Telephone Encounter (Signed)
Results were reviewed by myself.  The nurse notified the patient of the results.  These results forwarded to Dr. Ladona Ridgel to see what long-term instructions would be thank you

## 2019-10-07 NOTE — Progress Notes (Signed)
Patient ID: Miguel Bob., male    DOB: 09/04/1985, 34 y.o.   MRN: [id number]   Chief Complaint  Patient presents with  . Tinea Pedis  . Testicle Pain  . Rash   Subjective:    HPI  follow up on tinea pedis and groin rash.   Pt also noting having a pain in testicles. Started 10 days ago. Dull pain. Also noticed blood in semen twice and pain with urination after seeing the blood.  Started in left and felt like someone "flicked it." running more at work and then went left to right and back. But mostly on left, dull pain. Constantly dull.  Has had this before when sitting long peridos of time.  Last 2 times ejaculated had blood in semen and dysuria.  No new sexual partners and no concern of stds. No f, n/v/d No blood in urine. No buldging in area. Redness in left inguinal area.  Grandfather passed due to prostate cancer.   Medical History Miguel Bowman has no past medical history on file.   Outpatient Encounter Medications as of 10/07/2019  Medication Sig  . terbinafine (LAMISIL) 250 MG tablet Take 1 tablet (250 mg total) by mouth daily.  Marland Kitchen terbinafine (LAMISIL AT) 1 % cream Apply 1 application topically 2 (two) times daily.  . [DISCONTINUED] doxycycline (VIBRA-TABS) 100 MG tablet Take 1 tablet (100 mg total) by mouth 2 (two) times daily. (Patient not taking: Reported on 08/26/2019)  . [DISCONTINUED] polyethylene glycol (MIRALAX / GLYCOLAX) packet Take 17 g by mouth daily. (Patient not taking: Reported on 08/26/2019)   No facility-administered encounter medications on file as of 10/07/2019.     Review of Systems  Constitutional: Negative for chills and fever.  HENT: Negative for congestion, rhinorrhea and sore throat.   Respiratory: Negative for cough, shortness of breath and wheezing.   Cardiovascular: Negative for chest pain and leg swelling.  Gastrointestinal: Negative for abdominal pain, diarrhea, nausea and vomiting.  Genitourinary: Positive for testicular pain. Negative  for dysuria and frequency.       +blood in semen  Skin: Positive for rash (groin/foot).  Neurological: Negative for dizziness, weakness and headaches.     Vitals BP 110/74   Pulse 91   Temp (!) 97.3 F (36.3 C)   Ht 5\' 10"  (1.778 m)   Wt 236 lb (107 kg)   SpO2 97%   BMI 33.86 kg/m   Objective:   Physical Exam Vitals and nursing note reviewed.  Constitutional:      General: He is not in acute distress.    Appearance: Normal appearance. He is not ill-appearing.  Cardiovascular:     Rate and Rhythm: Normal rate and regular rhythm.     Pulses: Normal pulses.     Heart sounds: Normal heart sounds.  Pulmonary:     Effort: Pulmonary effort is normal. No respiratory distress.     Breath sounds: Normal breath sounds.  Genitourinary:    Penis: Normal.      Testes: Normal.     Comments: No inguinal hernia.  Left inguinal crease with erythema/rash. Musculoskeletal:        General: Normal range of motion.  Skin:    General: Skin is warm and dry.     Findings: Rash (groin) present.     Comments: +scaly skin on feet bilaterally, improving with the yellowing and thickness of toenails.  Neurological:     General: No focal deficit present.     Mental Status: He is alert  and oriented to person, place, and time.  Psychiatric:        Mood and Affect: Mood normal.        Behavior: Behavior normal.        Thought Content: Thought content normal.        Judgment: Judgment normal.      Assessment and Plan   1. Testicular pain, left - US Scrotum; Future - Ambulatory referral to Urology - GC/Chlamydia Probe Amp([facility])  2. Dysuria - POCT urinalysis dipstick - Ambulatory referral to Urology - GC/Chlamydia Probe Amp([facility])  3. Blood in semen - Ambulatory referral to Urology - GC/Chlamydia Probe Amp([facility])  4. Tinea cruris  5. Tinea pedis of both feet   Testicular pain/blood in semen- Stat scan of scrotum to r/o inguinal hernia vs testicular mass.  Referral given for  Urology.  Tinea pedis and onchymycosis- improving. cont with lamisil oral.  Tinea cruris- use topical lamisil.   F/u after urology appt and prn.

## 2019-10-07 NOTE — Telephone Encounter (Signed)
Dr Ladona Ridgel ordered stat US scrotum on Miguel Bowman today and results in after she left office. Do you want to review to see if Miguel Bowman needs to be notified tonight or send to dr taylor to sign off on tomorrow.

## 2019-10-08 NOTE — Telephone Encounter (Signed)
Thanks!  He has referral to f/u with Urology for the blood in semen.  Thx. Shelly

## 2019-10-11 LAB — GC/CHLAMYDIA PROBE AMP
Chlamydia trachomatis, NAA: NEGATIVE
Neisseria Gonorrhoeae by PCR: NEGATIVE

## 2019-11-04 ENCOUNTER — Other Ambulatory Visit: Payer: Self-pay | Admitting: Family Medicine

## 2019-11-04 DIAGNOSIS — B353 Tinea pedis: Secondary | ICD-10-CM

## 2019-11-07 NOTE — Telephone Encounter (Signed)
Pt only needing 12 week treatment then it should grow out over time for the toenail fungus. Can take months to grow out.  If not improved in 2-3 more months after discontinuing the meds, then would need to refer to podiatry.

## 2019-11-08 NOTE — Telephone Encounter (Signed)
Patient notified and verbalized understanding. 

## 2019-11-23 ENCOUNTER — Encounter: Payer: Self-pay | Admitting: Urology

## 2019-11-23 ENCOUNTER — Ambulatory Visit (INDEPENDENT_AMBULATORY_CARE_PROVIDER_SITE_OTHER): Payer: BC Managed Care – PPO | Admitting: Urology

## 2019-11-23 ENCOUNTER — Other Ambulatory Visit: Payer: Self-pay

## 2019-11-23 VITALS — BP 123/77 | HR 99 | Temp 97.7°F | Ht 70.0 in | Wt 236.0 lb

## 2019-11-23 DIAGNOSIS — R300 Dysuria: Secondary | ICD-10-CM | POA: Diagnosis not present

## 2019-11-23 LAB — URINALYSIS, ROUTINE W REFLEX MICROSCOPIC
Bilirubin, UA: NEGATIVE
Glucose, UA: NEGATIVE
Ketones, UA: NEGATIVE
Leukocytes,UA: NEGATIVE
Nitrite, UA: NEGATIVE
Protein,UA: NEGATIVE
RBC, UA: NEGATIVE
Specific Gravity, UA: 1.025 (ref 1.005–1.030)
Urobilinogen, Ur: 0.2 mg/dL (ref 0.2–1.0)
pH, UA: 5 (ref 5.0–7.5)

## 2019-11-23 MED ORDER — SULFAMETHOXAZOLE-TRIMETHOPRIM 800-160 MG PO TABS: 1.0000 | ORAL_TABLET | Freq: Two times a day (BID) | ORAL | 0 refills | Status: DC

## 2019-11-23 NOTE — Progress Notes (Signed)
11/23/2019 2:53 PM   Miguel Bowman. January 08, 1986 [id number]  Referring provider: Annalee Genta, DO [address] [place],  [place] [address]  Right groin pain  HPI:  Miguel Bowman is a 33yo here for evaluation of right groin pain. Starting in August 2021 on a long car ride home from the beach. He has intermittent penile pain that radiates to his glans. He is also have intermittent bilateral testis pain which is not tied to activity. He has intermittent dysuria. He has been having hematospermia since August. Strong stream. Intermittent straining to urinate. No nocturia.  PMH: No past medical history on file.  Surgical History: Past Surgical History:  Procedure Laterality Date  . none      Home Medications:  Allergies as of 11/23/2019      Reactions   Ceclor [cefaclor]       Medication List       Accurate as of November 23, 2019  2:53 PM. If you have any questions, ask your nurse or doctor.        terbinafine 1 % cream Commonly known as: LamISIL AT Apply 1 application topically 2 (two) times daily.   terbinafine 250 MG tablet Commonly known as: LAMISIL TAKE 1 TABLET(250 MG) BY MOUTH DAILY       Allergies:  Allergies  Allergen Reactions  . Ceclor [Cefaclor]     Family History: Family History  Problem Relation Age of Onset  . Colon cancer Neg Hx   . Inflammatory bowel disease Neg Hx   . Celiac disease Neg Hx     Social History:  reports that he has never smoked. He has never used smokeless tobacco. He reports that he does not drink alcohol and does not use drugs.  ROS: All other review of systems were reviewed and are negative except what is noted above in HPI  Physical Exam: BP 123/77   Pulse 99   Temp 97.7 F (36.5 C)   Ht 5\' 10"  (1.778 m)   Wt 236 lb (107 kg)   BMI 33.86 kg/m   Constitutional:  Alert and oriented, No acute distress. HEENT: [place] AT, moist mucus membranes.  Trachea midline, no masses. Cardiovascular: No clubbing, cyanosis, or  edema. Respiratory: Normal respiratory effort, no increased work of breathing. GI: Abdomen is soft, nontender, nondistended, no abdominal masses GU: No CVA tenderness. Circumcised phallus. No masses/lesions on penis, testis, scrotum. Prostate 20g smooth no nodules no induration.  Lymph: No cervical or inguinal lymphadenopathy. Skin: No rashes, bruises or suspicious lesions. Neurologic: Grossly intact, no focal deficits, moving all 4 extremities. Psychiatric: Normal mood and affect.  Laboratory Data: Lab Results  Component Value Date   WBC 6.7 03/07/2016   HGB 14.9 03/07/2016   HCT 44.2 03/07/2016   MCV 82 03/07/2016   PLT 362 03/07/2016    Lab Results  Component Value Date   CREATININE 1.01 03/07/2016    No results found for: PSA  No results found for: TESTOSTERONE  No results found for: HGBA1C  Urinalysis No results found for: COLORURINE, APPEARANCEUR, LABSPEC, PHURINE, GLUCOSEU, HGBUR, BILIRUBINUR, KETONESUR, PROTEINUR, UROBILINOGEN, NITRITE, LEUKOCYTESUR  No results found for: LABMICR, WBCUA, RBCUA, LABEPIT, MUCUS, BACTERIA  Pertinent Imaging:  No results found for this or any previous visit.  No results found for this or any previous visit.  No results found for this or any previous visit.  No results found for this or any previous visit.  No results found for this or any previous visit.  No results found for this or any previous visit.  No results found for this or any previous visit.  No results found for this or any previous visit.   Assessment & Plan:    1. Chronic Prostatitis -Bactrim DS for 28 days -RTC 4-6 weeks - Urinalysis, Routine w reflex microscopic   No follow-ups on file.  Wilkie Aye, MD  [facility] [place]

## 2019-11-23 NOTE — Progress Notes (Signed)
Urological Symptom Review  Patient is experiencing the following symptoms: Weak stream Penile pain (male only)    Review of Systems  Gastrointestinal (upper)  : Negative for upper GI symptoms  Gastrointestinal (lower) : Negative for lower GI symptoms  Constitutional : Negative for symptoms  Skin: Skin rash/lesion Itching  Eyes: Negative for eye symptoms  Ear/Nose/Throat : Negative for Ear/Nose/Throat symptoms  Hematologic/Lymphatic: Negative for Hematologic/Lymphatic symptoms  Cardiovascular : Negative for cardiovascular symptoms  Respiratory : Negative for respiratory symptoms  Endocrine: Negative for endocrine symptoms  Musculoskeletal: Negative for musculoskeletal symptoms  Neurological: Negative for neurological symptoms  Psychologic: Negative for psychiatric symptoms

## 2019-12-20 ENCOUNTER — Other Ambulatory Visit: Payer: Self-pay | Admitting: Family Medicine

## 2019-12-20 DIAGNOSIS — B353 Tinea pedis: Secondary | ICD-10-CM

## 2019-12-24 ENCOUNTER — Other Ambulatory Visit: Payer: Self-pay

## 2019-12-24 ENCOUNTER — Ambulatory Visit (INDEPENDENT_AMBULATORY_CARE_PROVIDER_SITE_OTHER): Payer: BC Managed Care – PPO | Admitting: Urology

## 2019-12-24 ENCOUNTER — Encounter: Payer: Self-pay | Admitting: Urology

## 2019-12-24 VITALS — BP 118/75 | HR 83 | Temp 98.0°F | Ht 70.0 in | Wt 236.0 lb

## 2019-12-24 DIAGNOSIS — R300 Dysuria: Secondary | ICD-10-CM | POA: Diagnosis not present

## 2019-12-24 DIAGNOSIS — N411 Chronic prostatitis: Secondary | ICD-10-CM | POA: Diagnosis not present

## 2019-12-24 LAB — URINALYSIS, ROUTINE W REFLEX MICROSCOPIC
Bilirubin, UA: NEGATIVE
Glucose, UA: NEGATIVE
Leukocytes,UA: NEGATIVE
Nitrite, UA: NEGATIVE
Protein,UA: NEGATIVE
RBC, UA: NEGATIVE
Specific Gravity, UA: 1.025 (ref 1.005–1.030)
Urobilinogen, Ur: 1 mg/dL (ref 0.2–1.0)
pH, UA: 7 (ref 5.0–7.5)

## 2019-12-24 NOTE — Patient Instructions (Signed)

## 2019-12-24 NOTE — Progress Notes (Signed)

## 2019-12-24 NOTE — Progress Notes (Signed)
12/24/2019 3:17 PM   Miguel Bowman. [birth year] [id number]  Referring provider: Annalee Genta, DO [address] [place],  [place] [address]  Penile pain  HPI: Miguel Bowman is a 33yo here for followup for chronic prostatitis. He was given 28 days of bactrim and the pelvic and penile resolved. No LUTS.  He is happy with his urination   PMH: No past medical history on file.  Surgical History: Past Surgical History:  Procedure Laterality Date  . none      Home Medications:  Allergies as of 12/24/2019      Reactions   Ceclor [cefaclor]       Medication List       Accurate as of December 24, 2019  3:17 PM. If you have any questions, ask your nurse or doctor.        sulfamethoxazole-trimethoprim 800-160 MG tablet Commonly known as: BACTRIM DS Take 1 tablet by mouth every 12 (twelve) hours.   terbinafine 1 % cream Commonly known as: LamISIL AT Apply 1 application topically 2 (two) times daily.   terbinafine 250 MG tablet Commonly known as: LAMISIL TAKE 1 TABLET(250 MG) BY MOUTH DAILY       Allergies:  Allergies  Allergen Reactions  . Ceclor [Cefaclor]     Family History: Family History  Problem Relation Age of Onset  . Colon cancer Neg Hx   . Inflammatory bowel disease Neg Hx   . Celiac disease Neg Hx     Social History:  reports that he has never smoked. He has never used smokeless tobacco. He reports that he does not drink alcohol and does not use drugs.  ROS: All other review of systems were reviewed and are negative except what is noted above in HPI  Physical Exam: BP 118/75   Pulse 83   Temp 98 F (36.7 C)   Ht 5\' 10"  (1.778 m)   Wt 236 lb (107 kg)   BMI 33.86 kg/m   Constitutional:  Alert and oriented, No acute distress. HEENT: [place] AT, moist mucus membranes.  Trachea midline, no masses. Cardiovascular: No clubbing, cyanosis, or edema. Respiratory: Normal respiratory effort, no increased work of breathing. GI: Abdomen is soft,  nontender, nondistended, no abdominal masses GU: No CVA tenderness.  Lymph: No cervical or inguinal lymphadenopathy. Skin: No rashes, bruises or suspicious lesions. Neurologic: Grossly intact, no focal deficits, moving all 4 extremities. Psychiatric: Normal mood and affect.  Laboratory Data: Lab Results  Component Value Date   WBC 6.7 03/07/2016   HGB 14.9 03/07/2016   HCT 44.2 03/07/2016   MCV 82 03/07/2016   PLT 362 03/07/2016    Lab Results  Component Value Date   CREATININE 1.01 03/07/2016    No results found for: PSA  No results found for: TESTOSTERONE  No results found for: HGBA1C  Urinalysis    Component Value Date/Time   APPEARANCEUR Clear 12/24/2019 1436   GLUCOSEU Negative 12/24/2019 1436   BILIRUBINUR Negative 12/24/2019 1436   PROTEINUR Negative 12/24/2019 1436   NITRITE Negative 12/24/2019 1436   LEUKOCYTESUR Negative 12/24/2019 1436    Lab Results  Component Value Date   LABMICR Comment 12/24/2019    Pertinent Imaging:  No results found for this or any previous visit.  No results found for this or any previous visit.  No results found for this or any previous visit.  No results found for this or any previous visit.  No results found for this or any previous visit.  No results found for this or any previous visit.  No results found for this or any previous visit.  No results found for this or any previous visit.   Assessment & Plan:    1. Chronic prostatitis without hematuria -resolved. RTC prn   No follow-ups on file.  Wilkie Aye, MD  [facility]

## 2020-01-06 ENCOUNTER — Ambulatory Visit: Payer: Self-pay

## 2020-01-06 ENCOUNTER — Ambulatory Visit: Payer: BC Managed Care – PPO | Attending: Internal Medicine

## 2020-01-06 DIAGNOSIS — Z23 Encounter for immunization: Secondary | ICD-10-CM

## 2020-01-06 NOTE — Progress Notes (Signed)
   [facility]  Name:  Maruice Pieroni.    MRN: [id number] DOB: Sep 10, 1985  01/06/2020  Mr. Niehoff was observed post Covid-19 immunization for 15 minutes without incident. He was provided with Vaccine Information Sheet and instruction to access the V-Safe system.   Mr. Seto was instructed to call 911 with any severe reactions post vaccine: Marland Kitchen Difficulty breathing  . Swelling of face and throat  . A fast heartbeat  . A bad rash all over body  . Dizziness and weakness   Immunizations Administered    Name Date Dose VIS Date Route   Pfizer COVID-19 Vaccine 01/06/2020  1:38 PM 0.3 mL 11/24/2019 Intramuscular   Manufacturer: [employer], [employer]   Lot: [id number]   NDC: [address]-1

## 2020-05-25 ENCOUNTER — Other Ambulatory Visit: Payer: Self-pay

## 2020-05-25 ENCOUNTER — Ambulatory Visit
Admission: EM | Admit: 2020-05-25 | Discharge: 2020-05-25 | Disposition: A | Discharge status: Home / Self Care | Payer: BC Managed Care – PPO | Attending: Emergency Medicine | Admitting: Emergency Medicine

## 2020-05-25 ENCOUNTER — Encounter: Payer: Self-pay | Admitting: Emergency Medicine

## 2020-05-25 DIAGNOSIS — R6889 Other general symptoms and signs: Secondary | ICD-10-CM

## 2020-05-25 DIAGNOSIS — W57XXXA Bitten or stung by nonvenomous insect and other nonvenomous arthropods, initial encounter: Secondary | ICD-10-CM

## 2020-05-25 DIAGNOSIS — S30861A Insect bite (nonvenomous) of abdominal wall, initial encounter: Secondary | ICD-10-CM

## 2020-05-25 MED ORDER — DOXYCYCLINE HYCLATE 100 MG PO CAPS: 100.0000 mg | ORAL_CAPSULE | Freq: Two times a day (BID) | ORAL | 0 refills | Status: AC

## 2020-05-25 NOTE — ED Triage Notes (Signed)
Was bit by a deer tick last Tuesday.  States he started to feel achy the next day and headaches.  States his joints have started to hurt. Chills last night.

## 2020-05-25 NOTE — ED Provider Notes (Signed)
[facility]   [id number] 05/25/20 Arrival Time: 2458   CC: tick bite  SUBJECTIVE: History from: patient.  Miguel Swab. is a 35 y.o. male who presents with fever, 100.3 at home, cold chills, headache, nausea, and body aches x few days ago.  Bit by a deer tick 10 days ago to RT lower abdomen, felt stinging pain and removed tick.  States it had just bit him and was not engorged or present >48 hours.   Has tried OTC medications with minimal relief.  Denies aggravating factors.  Denies previous symptoms in the past.   Denies sinus pain, rhinorrhea, sore throat, SOB, wheezing, chest pain, changes in bowel or bladder habits.     ROS: As per HPI.  All other pertinent ROS negative.     History reviewed. No pertinent past medical history. Past Surgical History:  Procedure Laterality Date  . none     Allergies  Allergen Reactions  . Ceclor [Cefaclor]    No current facility-administered medications on file prior to encounter.   Current Outpatient Medications on File Prior to Encounter  Medication Sig Dispense Refill  . sulfamethoxazole-trimethoprim (BACTRIM DS) 800-160 MG tablet Take 1 tablet by mouth every 12 (twelve) hours. 56 tablet 0  . terbinafine (LAMISIL AT) 1 % cream Apply 1 application topically 2 (two) times daily. 60 g 0  . terbinafine (LAMISIL) 250 MG tablet TAKE 1 TABLET(250 MG) BY MOUTH DAILY 30 tablet 0   Social History   Socioeconomic History  . Marital status: Married    Spouse name: Not on file  . Number of children: Not on file  . Years of education: Not on file  . Highest education level: Not on file  Occupational History  . 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 Smoking status: Never Smoker  . Smokeless tobacco: Never Used  Substance and Sexual Activity  . Alcohol use: No  . Drug use: No  . Sexual activity: Not on file  Other Topics Concern  . Not on file  Social History Narrative  . Not on file    Social Determinants of Health   Financial Resource Strain: Not on file  Food Insecurity: Not on file  Transportation Needs: Not on file  Physical Activity: Not on file  Stress: Not on file  Social Connections: Not on file  Intimate Partner Violence: Not on file   Family History  Problem Relation Age of Onset  . Sarcoidosis Mother   . Healthy Father   . Colon cancer Neg Hx   . Inflammatory bowel disease Neg Hx   . Celiac disease Neg Hx     OBJECTIVE:  Vitals:   05/25/20 0820  BP: 109/74  Pulse: 85  Resp: 18  Temp: 99.5 F (37.5 C)  TempSrc: Oral  SpO2: 94%     General appearance: alert; well-appearing, but nontoxic; speaking in full sentences and tolerating own secretions HEENT: NCAT; Ears: EACs clear; Eyes: PERRL.  EOM grossly intact. Sinuses: nontender; Nose: nares patent without rhinorrhea, Throat: oropharynx clear, tonsils non erythematous or enlarged, uvula midline  Neck: supple without LAD Lungs: unlabored respirations, symmetrical air entry; cough: absent; no respiratory distress; CTAB Heart: regular rate and rhythm.   Abdomen: soft, nondistended, normal active bowel sounds; nontender to palpation; no guarding  Skin: warm and dry; small punctate lesion to RT lower abdomen Psychological: alert and cooperative; normal mood and affect; pleasant  ASSESSMENT & PLAN:  1. Tick bite of abdomen,  initial encounter   2. Flu-like symptoms     Meds ordered this encounter  Medications  . doxycycline (VIBRAMYCIN) 100 MG capsule    Sig: Take 1 capsule (100 mg total) by mouth 2 (two) times daily for 14 days.    Dispense:  28 capsule    Refill:  0    Order Specific Question:   Supervising Provider    Answer:   Eustace Moore [[id number]]   Prescribed doxycycline To prevent tick bites, wear long sleeves, long pants, and light colors. Use insect repellent. Follow the instructions on the bottle. If the tick is biting, do not try to remove it with heat, alcohol,  petroleum jelly, or fingernail polish. Use tweezers, curved forceps, or a tick-removal tool to grasp the tick. Gently pull up until the tick lets go. Do not twist or jerk the tick. Do not squeeze or crush the tick. Return here or go to ER if you have any new or worsening symptoms (rash, nausea, vomiting, fever, chills, headache, fatigue)   Reviewed expectations re: course of current medical issues. Questions answered. Outlined signs and symptoms indicating need for more acute intervention. Patient verbalized understanding. After Visit Summary given.         Rennis Harding, PA-C 05/25/20 [phone]

## 2020-05-25 NOTE — Discharge Instructions (Signed)
Prescribed doxycycline To prevent tick bites, wear long sleeves, long pants, and light colors. Use insect repellent. Follow the instructions on the bottle. If the tick is biting, do not try to remove it with heat, alcohol, petroleum jelly, or fingernail polish. Use tweezers, curved forceps, or a tick-removal tool to grasp the tick. Gently pull up until the tick lets go. Do not twist or jerk the tick. Do not squeeze or crush the tick. Return here or go to ER if you have any new or worsening symptoms (rash, nausea, vomiting, fever, chills, headache, fatigue)  

## 2020-05-25 NOTE — ED Triage Notes (Signed)
States he took a home covid test that was negative.

## 2020-05-29 ENCOUNTER — Ambulatory Visit: Payer: BC Managed Care – PPO | Admitting: Family Medicine

## 2020-05-29 ENCOUNTER — Other Ambulatory Visit: Payer: Self-pay

## 2020-05-29 ENCOUNTER — Encounter: Payer: Self-pay | Admitting: Family Medicine

## 2020-05-29 VITALS — BP 133/73 | HR 68 | Temp 98.1°F | Ht 70.0 in | Wt 244.0 lb

## 2020-05-29 DIAGNOSIS — W57XXXD Bitten or stung by nonvenomous insect and other nonvenomous arthropods, subsequent encounter: Secondary | ICD-10-CM

## 2020-05-29 DIAGNOSIS — W57XXXA Bitten or stung by nonvenomous insect and other nonvenomous arthropods, initial encounter: Secondary | ICD-10-CM | POA: Insufficient documentation

## 2020-05-29 DIAGNOSIS — M542 Cervicalgia: Secondary | ICD-10-CM

## 2020-05-29 NOTE — Progress Notes (Signed)
Patient ID: Miguel Bowman., male    DOB: 08-19-1985, 35 y.o.   MRN: [id number]   No chief complaint on file.  Subjective:  CC: follow-up on tick bite/UC visit  This is a new problem.  Presents today after a tick bite.  Reports he found a tick on his right side on Tuesday, April 12, 1 week later started to feel achy, and next Monday started to feel sore, on Tuesday felt sore took Advil Tylenol, on Wednesday started having muscle aches, felt bad achy weak and pain, took Tylenol and felt better by April 21, decided to go to the urgent care and was started on doxycycline for 14 days.  Reports that he had chills about 4 days ago, denies fever.  Has an issue with area on the back of his neck feeling painful.   body aches for one week. Then had chills 4 days ago for that one day. Went to urgent care last week on 4/21and prescribed doxy.  Pulled tick off of abdomen 2 weeks ago. Feeling better except having neck pain.    Medical History [name] has no past medical history on file.   Outpatient Encounter Medications as of 05/29/2020  Medication Sig  . doxycycline (VIBRAMYCIN) 100 MG capsule Take 1 capsule (100 mg total) by mouth 2 (two) times daily for 14 days.  . [DISCONTINUED] terbinafine (LAMISIL AT) 1 % cream Apply 1 application topically 2 (two) times daily.  . [DISCONTINUED] terbinafine (LAMISIL) 250 MG tablet TAKE 1 TABLET(250 MG) BY MOUTH DAILY  . [DISCONTINUED] sulfamethoxazole-trimethoprim (BACTRIM DS) 800-160 MG tablet Take 1 tablet by mouth every 12 (twelve) hours.   No facility-administered encounter medications on file as of 05/29/2020.     Review of Systems  Constitutional: Negative for chills and fever.  Respiratory: Positive for shortness of breath.        Felt out of breath x 1 on Friday- resolved quickly after 10-15 minutes.   Cardiovascular: Negative for chest pain.  Gastrointestinal: Negative for abdominal pain.  Genitourinary: Negative for dysuria.  Musculoskeletal:  Positive for neck pain. Negative for arthralgias, myalgias and neck stiffness.     Vitals BP 133/73   Pulse 68   Temp 98.1 F (36.7 C)   Ht 5\' 10"  (1.778 m)   Wt 244 lb (110.7 kg)   SpO2 94%   BMI 35.01 kg/m   Objective:   Physical Exam Vitals reviewed.  Constitutional:      General: He is not in acute distress.    Appearance: He is not ill-appearing.  Cardiovascular:     Rate and Rhythm: Normal rate and regular rhythm.     Heart sounds: Normal heart sounds.  Pulmonary:     Effort: Pulmonary effort is normal.     Breath sounds: Normal breath sounds.  Musculoskeletal:     Cervical back: Normal range of motion. No edema, erythema or signs of trauma.  Skin:    General: Skin is warm and dry.     Comments: Reports area on back of neck with "pain"  No erythema, no warmth.   Neurological:     General: No focal deficit present.     Mental Status: He is alert.  Psychiatric:        Behavior: Behavior normal.      Assessment and Plan   1. Tick bite, unspecified site, subsequent encounter - CBC with Differential - Comprehensive Metabolic Panel (CMET)  2. Posterior neck pain - CBC with Differential - Comprehensive Metabolic  Panel (CMET)   Will get CBC and CMP today.  We will continue to take doxycycline for full course as prescribed by urgent care.  Denies rash, erythema at tick bite site.  Has good range of motion on his neck, no erythema, no warmth, no rash noted today on neck.  Agrees with plan of care discussed today. Understands warning signs to seek further care: chest pain, shortness of breath, any significant change in health.  Understands to follow-up in 1 month, will notify once results of lab work are available. Will watch and wait to see if neck symptoms resolve after completing course of Doxy.   [name], NP 05/29/2020

## 2020-05-29 NOTE — Patient Instructions (Signed)
Tick Bite Information, Adult  Ticks are insects that can bite. Most ticks live in shrubs and grassy areas. They climb onto people and animals that go by. Then they bite. Some ticks carry germs that can make you sick. How can I prevent tick bites? Take these steps: Use insect repellent  Use an insect repellent that has 20% or higher of the ingredients DEET, picaridin, or IR3535. Follow the instructions on the label. Put it on: ? Bare skin. ? The tops of your boots. ? Your pant legs. ? The ends of your sleeves.  If you use an insect repellent that has the ingredient permethrin, follow the instructions on the label. Put it on: ? Clothing. ? Boots. ? Supplies or outdoor gear. ? Tents. When you are outside  Wear long sleeves and long pants.  Wear light-colored clothes.  Tuck your pant legs into your socks.  Stay in the middle of the trail. Do not touch the bushes.  Avoid walking through long grass.  Check for ticks on your clothes, hair, and skin often while you are outside. Before going inside your house, check your clothes, skin, head, neck, armpits, waist, groin, and joint areas. When you go indoors  Check your clothes for ticks. Dry your clothes in a dryer on high heat for 10 minutes or more. If clothes are damp, additional time may be needed.  Wash your clothes right away if they need to be washed. Use hot water.  Check your pets and outdoor gear.  Shower right away.  Check your body for ticks. Do a full body check using a mirror. What is the right way to remove a tick? Remove the tick from your skin as soon as possible. Do not remove the tick with your bare fingers.  To remove a tick that is crawling on your skin: ? Go outdoors and brush the tick off. ? Use tape or a lint roller.  To remove a tick that is biting: 1. Wash your hands. 2. If you have latex gloves, put them on. 3. Use tweezers, curved forceps, or a tick-removal tool to grasp the tick. Grasp the tick  as close to your skin and as close to the tick's head as possible. 4. Gently pull up until the tick lets go.  Try to keep the tick's head attached to its body.  Do not twist or jerk the tick.  Do not squeeze or crush the tick. Do not try to remove a tick with heat, alcohol, petroleum jelly, or fingernail polish.   What should I do after taking out a tick?  Throw away the tick. Do not crush a tick with your fingers.  Clean the bite area and your hands with soap and water, rubbing alcohol, or an iodine wash.  If an antiseptic cream or ointment is available, apply a small amount to the bite area.  Wash and disinfect any instruments that you used to remove the tick. How should I get rid of a live tick? To dispose of a live tick, use one of these methods:  Place the tick in rubbing alcohol.  Place the tick in a bag or container you can close tightly.  Wrap the tick tightly in tape.  Flush the tick down the toilet. Contact a doctor if:  You have symptoms, such as: ? A fever or chills. ? A red rash that makes a circle (bull's-eye rash) in the bite area. ? Redness and swelling where the tick bit you. ? Headache. ?   Pain in a muscle, joint, or bone. ? Being more tired than normal. ? Trouble walking or moving your legs. ? Numbness in your legs. ? Tender and swollen lymph glands.  A part of a tick breaks off and gets stuck in your skin. Get help right away if:  You cannot remove a tick.  You cannot move (have paralysis) or feel weak.  You are feeling worse or have new symptoms.  You find a tick that is biting you and filled with blood. This is important if you are in an area where diseases from ticks are common. Summary  Ticks may carry germs that can make you sick.  To prevent tick bites wear long sleeves, long pants, and light colors. Use insect repellent. Follow the instructions on the label.  If the tick is biting, do not try to remove it with heat, alcohol, petroleum  jelly, or fingernail polish.  Use tweezers, curved forceps, or a tick-removal tool to grasp the tick. Gently pull up until the tick lets go. Do not twist or jerk the tick. Do not squeeze or crush the tick.  If you have symptoms, contact a doctor. This information is not intended to replace advice given to you by your health care provider. Make sure you discuss any questions you have with your health care provider. Document Revised: 01/18/2019 Document Reviewed: 01/18/2019 Elsevier Patient Education  2021 [employer]  

## 2020-05-30 LAB — CBC WITH DIFFERENTIAL/PLATELET
Basophils Absolute: 0.1 10*3/uL (ref 0.0–0.2)
Basos: 1 %
EOS (ABSOLUTE): 0.2 10*3/uL (ref 0.0–0.4)
Eos: 3 %
Hematocrit: 43.7 % (ref 37.5–51.0)
Hemoglobin: 14.4 g/dL (ref 13.0–17.7)
Immature Grans (Abs): 0 10*3/uL (ref 0.0–0.1)
Immature Granulocytes: 1 %
Lymphocytes Absolute: 1.9 10*3/uL (ref 0.7–3.1)
Lymphs: 34 %
MCH: 27.6 pg (ref 26.6–33.0)
MCHC: 33 g/dL (ref 31.5–35.7)
MCV: 84 fL (ref 79–97)
Monocytes Absolute: 0.6 10*3/uL (ref 0.1–0.9)
Monocytes: 11 %
Neutrophils Absolute: 2.9 10*3/uL (ref 1.4–7.0)
Neutrophils: 50 %
Platelets: 267 10*3/uL (ref 150–450)
RBC: 5.21 x10E6/uL (ref 4.14–5.80)
RDW: 13.1 % (ref 11.6–15.4)
WBC: 5.6 10*3/uL (ref 3.4–10.8)

## 2020-05-30 LAB — COMPREHENSIVE METABOLIC PANEL
ALT: 33 IU/L (ref 0–44)
AST: 23 IU/L (ref 0–40)
Albumin/Globulin Ratio: 2.3 — ABNORMAL HIGH (ref 1.2–2.2)
Albumin: 4.6 g/dL (ref 4.0–5.0)
Alkaline Phosphatase: 68 IU/L (ref 44–121)
BUN/Creatinine Ratio: 17 (ref 9–20)
BUN: 16 mg/dL (ref 6–20)
Bilirubin Total: 0.4 mg/dL (ref 0.0–1.2)
CO2: 23 mmol/L (ref 20–29)
Calcium: 9 mg/dL (ref 8.7–10.2)
Chloride: 108 mmol/L — ABNORMAL HIGH (ref 96–106)
Creatinine, Ser: 0.92 mg/dL (ref 0.76–1.27)
Globulin, Total: 2 g/dL (ref 1.5–4.5)
Glucose: 81 mg/dL (ref 65–99)
Potassium: 4.1 mmol/L (ref 3.5–5.2)
Sodium: 146 mmol/L — ABNORMAL HIGH (ref 134–144)
Total Protein: 6.6 g/dL (ref 6.0–8.5)
eGFR: 112 mL/min/{1.73_m2} (ref >59)

## 2020-07-10 ENCOUNTER — Ambulatory Visit: Payer: BC Managed Care – PPO | Admitting: Family Medicine

## 2021-08-22 ENCOUNTER — Encounter: Payer: Self-pay | Admitting: Family Medicine

## 2021-08-22 ENCOUNTER — Ambulatory Visit: Payer: BC Managed Care – PPO | Admitting: Family Medicine

## 2021-08-22 DIAGNOSIS — J988 Other specified respiratory disorders: Secondary | ICD-10-CM | POA: Diagnosis not present

## 2021-08-22 MED ORDER — DOXYCYCLINE HYCLATE 100 MG PO TABS: 100.0000 mg | ORAL_TABLET | Freq: Two times a day (BID) | ORAL | 0 refills | Status: DC

## 2021-08-22 NOTE — Patient Instructions (Signed)
Medication if needed.  Follow up annually.  Take care  Dr. Adriana Simas

## 2021-08-23 DIAGNOSIS — J988 Other specified respiratory disorders: Secondary | ICD-10-CM | POA: Insufficient documentation

## 2021-08-23 NOTE — Progress Notes (Signed)
Subjective:  Patient ID: Miguel Shove., male    DOB: 06/29/1985  Age: 36 y.o. MRN: [id number]  CC: Chief Complaint  Patient presents with   Establish Care    Former patient of Dr.Taylor. Pt recently had cold and possible pink eye but has improved     HPI:  36 year old male presents for evaluation of recent respiratory symptoms.  Patient reports his symptoms started Wednesday of last week.  His daughter has recently been sick.  He subsequently developed symptoms.  He has had productive cough, fever which has now resolved.  He is also had pinkeye which was treated with antibiotic eyedrops by ophthalmology.  He is currently feeling improved as of today.  He has had other sick contacts as well.  No other complaints or concerns at this time.  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  Tobacco Use   Smoking status: Never   Smokeless tobacco: Never  Substance and Sexual Activity   Alcohol use: No   Drug use: No   Sexual activity: Not on file  Other Topics Concern   Not on file  Social History Narrative   Not on file   Social Determinants of Health   Financial Resource Strain: Not on file  Food Insecurity: Not on file  Transportation Needs: Not on file  Physical Activity: Not on file  Stress: Not on file  Social Connections: Not on file    Review of Systems Per HPI  Objective:  BP 112/62   Pulse 85   Temp 97.8 F (36.6 C)   Wt 244 lb 6.4 oz (110.9 kg)   SpO2 94%   BMI 35.07 kg/m      08/22/2021    2:33 PM 05/29/2020    1:11 PM 05/25/2020    8:20 AM  BP/Weight   Systolic BP 112 133 109  Diastolic BP 62 73 74  Wt. (Lbs) 244.4 244   BMI 35.07 kg/m2 35.01 kg/m2     Physical Exam Vitals and nursing note reviewed.  Constitutional:      General: He is not in acute distress.    Appearance: Normal appearance. He is obese.  HENT:     Head: Normocephalic and atraumatic.     Right Ear: Tympanic membrane normal.     Left Ear: Tympanic membrane normal.     Mouth/Throat:     Pharynx: Oropharynx is clear.  Eyes:     Conjunctiva/sclera: Conjunctivae normal.  Cardiovascular:     Rate and Rhythm: Normal rate and regular rhythm.  Pulmonary:     Effort: Pulmonary effort is normal.     Breath sounds: Normal breath sounds. No wheezing, rhonchi or rales.  Neurological:     Mental Status: He is alert.  Psychiatric:        Mood and Affect: Mood normal.        Behavior: Behavior normal.     Lab Results  Component Value Date   WBC 5.6 05/29/2020   HGB 14.4 05/29/2020   HCT 43.7 05/29/2020   PLT 267  05/29/2020   GLUCOSE 81 05/29/2020   CHOL 185 03/29/2014   TRIG 165 (H) 03/29/2014   HDL 46 03/29/2014   LDLCALC 106 (H) 03/29/2014   ALT 33 05/29/2020   AST 23 05/29/2020   NA 146 (H) 05/29/2020   K 4.1 05/29/2020   CL 108 (H) 05/29/2020   CREATININE 0.92 05/29/2020   BUN 16 05/29/2020   CO2 23 05/29/2020   TSH 2.270 03/07/2016     Assessment & Plan:   Problem List Items Addressed This Visit       Respiratory   Respiratory infection    Patient seems to be doing well at this time.  Advised that if he worsens again he can start the antibiotic prescription that was sent.  Advised supportive care.       Meds ordered this encounter  Medications   doxycycline (VIBRA-TABS) 100 MG tablet    Sig: Take 1 tablet (100 mg total) by mouth 2 (two) times daily.    Dispense:  14 tablet    Refill:  0    Siena Poehler DO [facility]

## 2021-08-23 NOTE — Assessment & Plan Note (Signed)
Patient seems to be doing well at this time.  Advised that if he worsens again he can start the antibiotic prescription that was sent.  Advised supportive care.

## 2021-09-02 IMAGING — US US SCROTUM
1 series · 14 of 25 positions shown · non-contrast
Comparison: None.

CLINICAL DATA: Left testicular pain for 10 days. The patient
currently has right testicular pain. Dysuria.

EXAM:
SCROTAL ULTRASOUND
DOPPLER ULTRASOUND OF THE TESTICLES
TECHNIQUE: Complete ultrasound examination of the testicles, epididymis, and
other scrotal structures was performed. Color and spectral Doppler
ultrasound were also utilized to evaluate blood flow to the
testicles.

[Series 1: us scrotum · 14 of 69 slices shown]
[im 1/69]
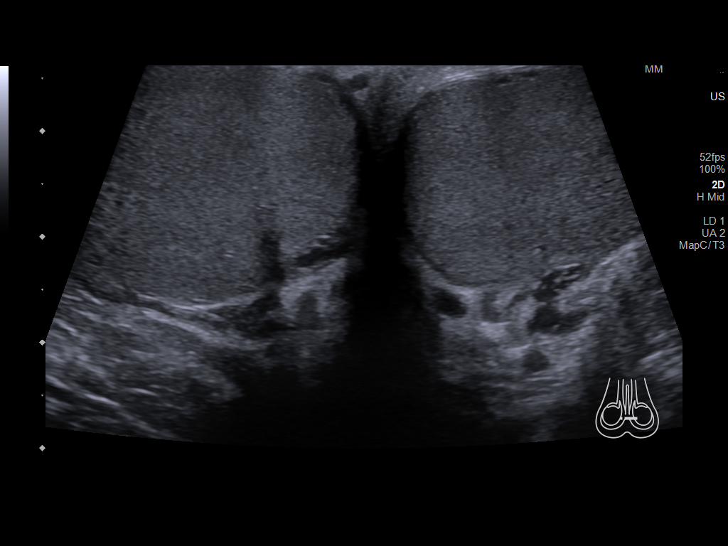
[im 6/69]
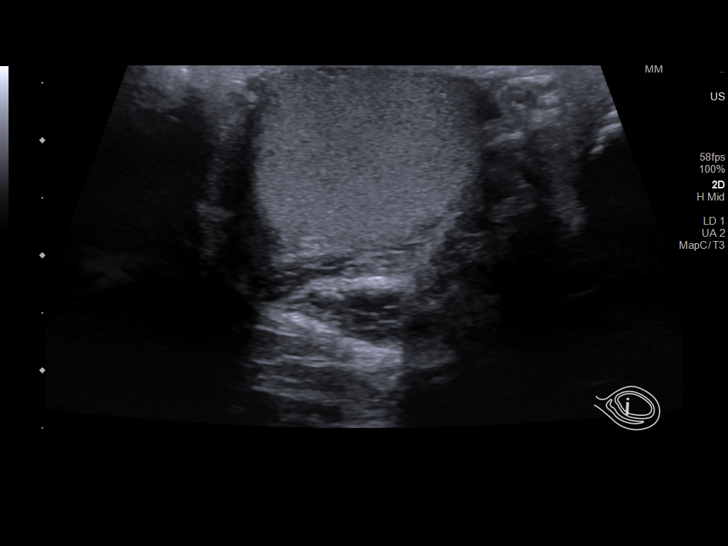
[im 12/69]
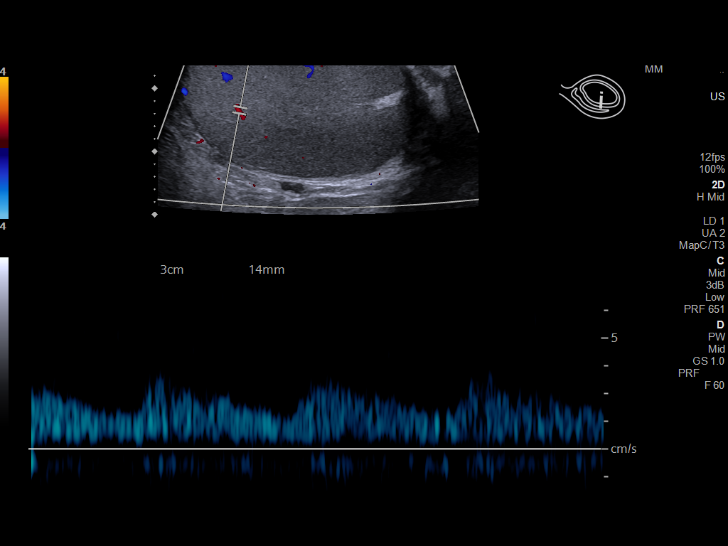
[im 18/69]
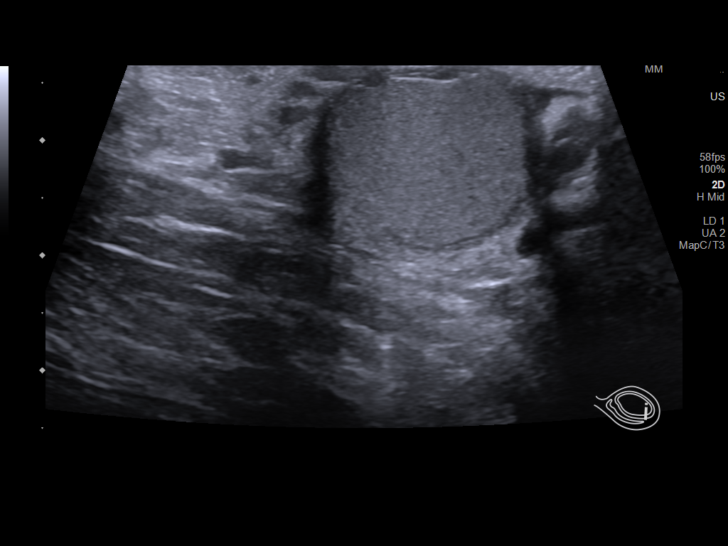
[im 23/69]
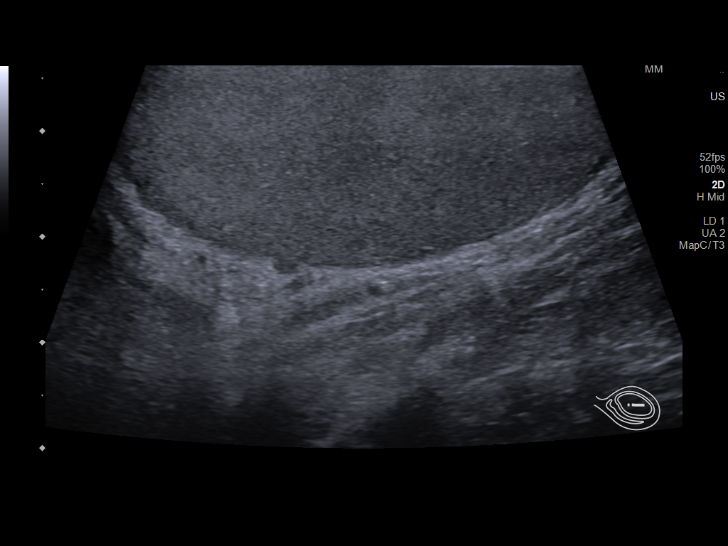
[im 26/69]
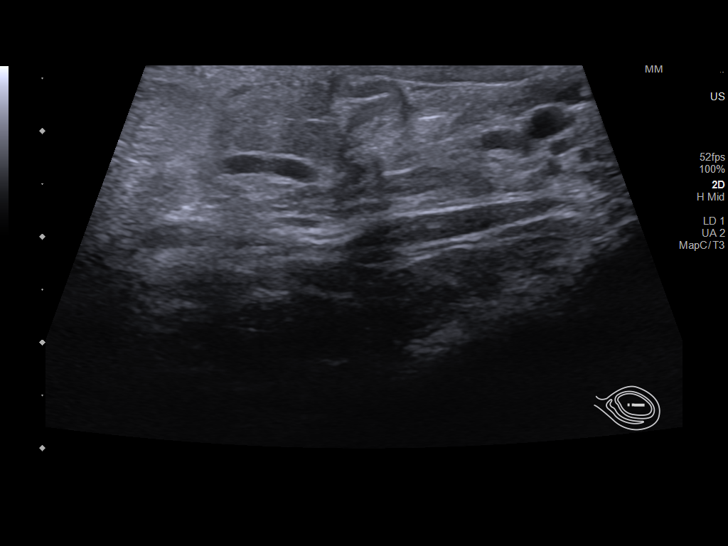
[im 32/69]
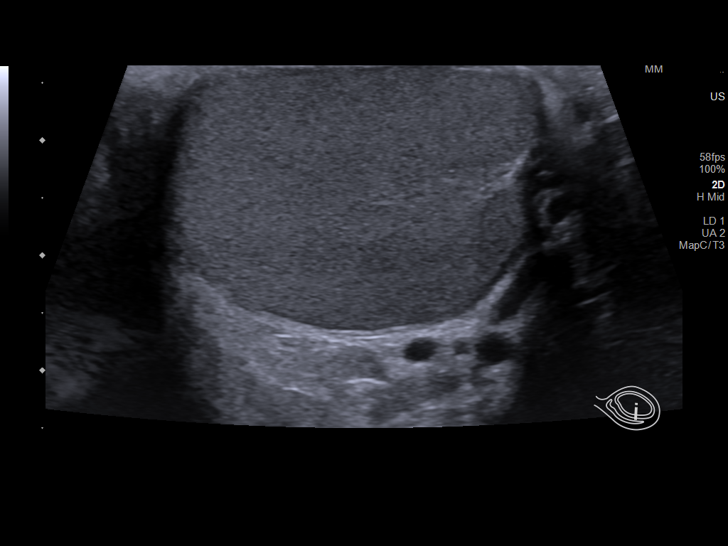
[im 37/69]
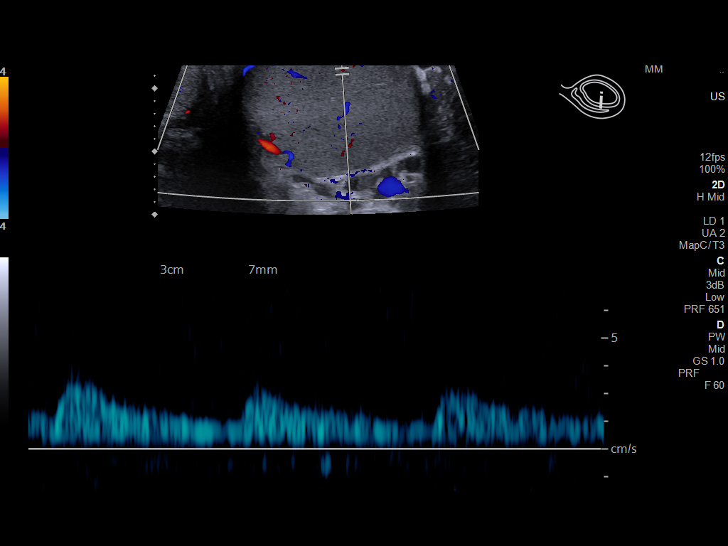
[im 43/69]
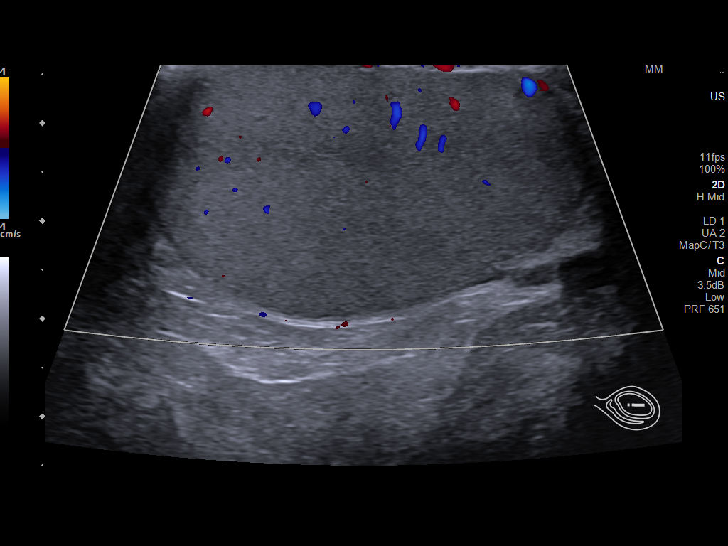
[im 46/69]
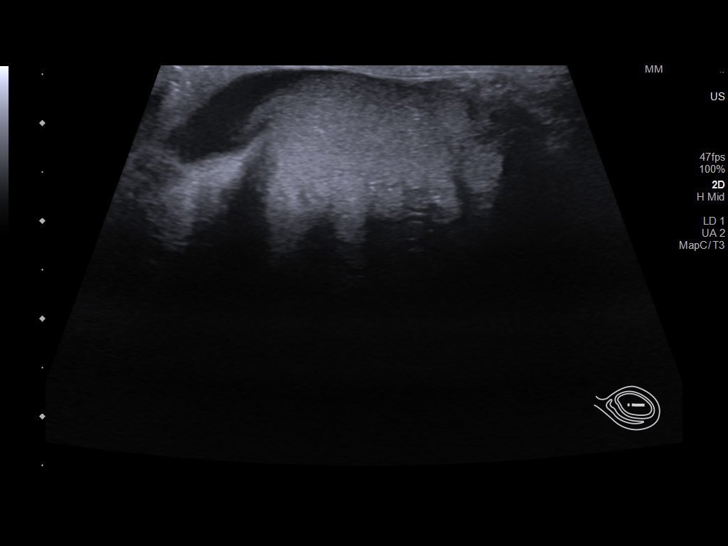
[im 52/69]
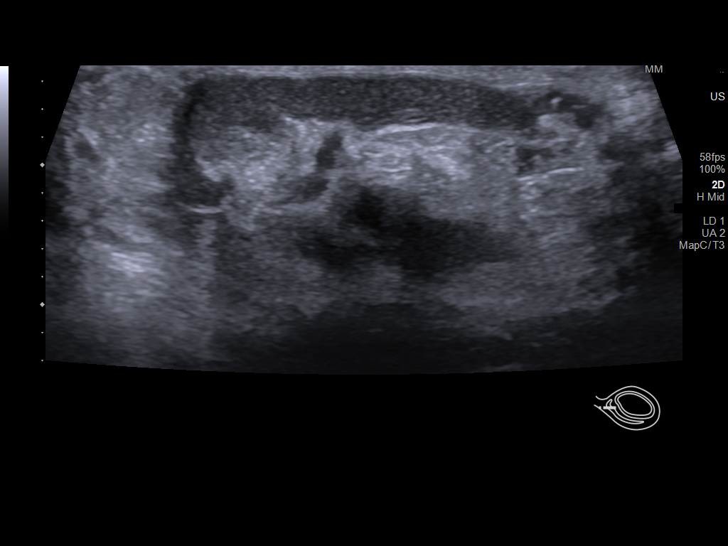
[im 57/69]
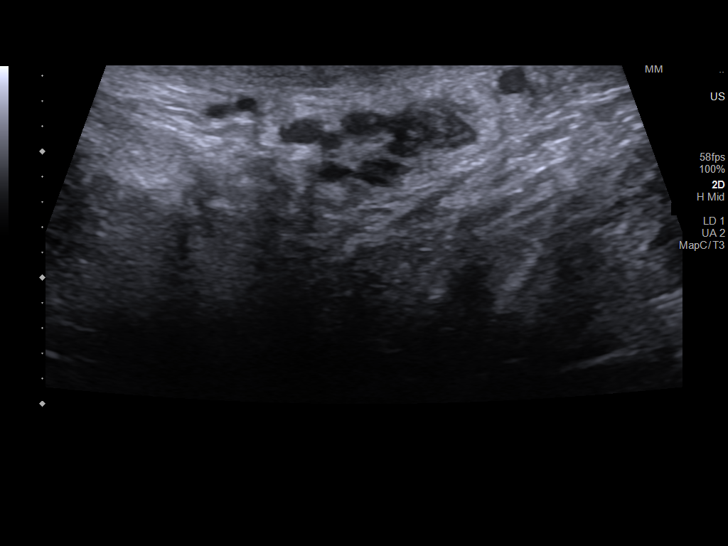
[im 63/69]
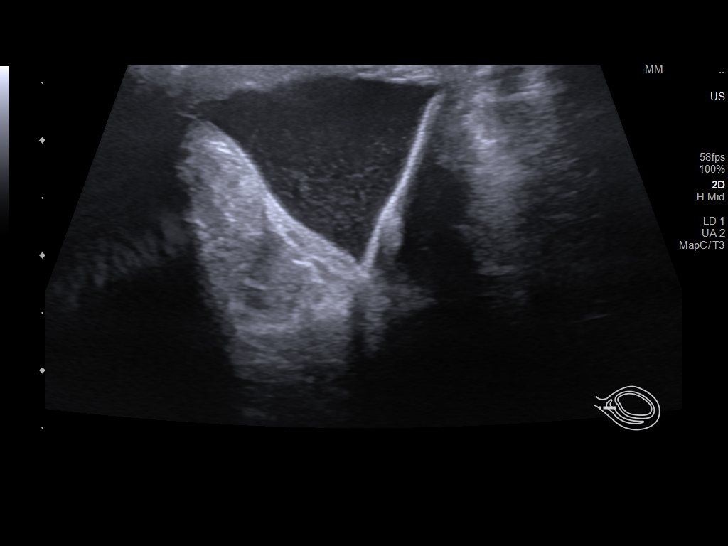
[im 69/69]
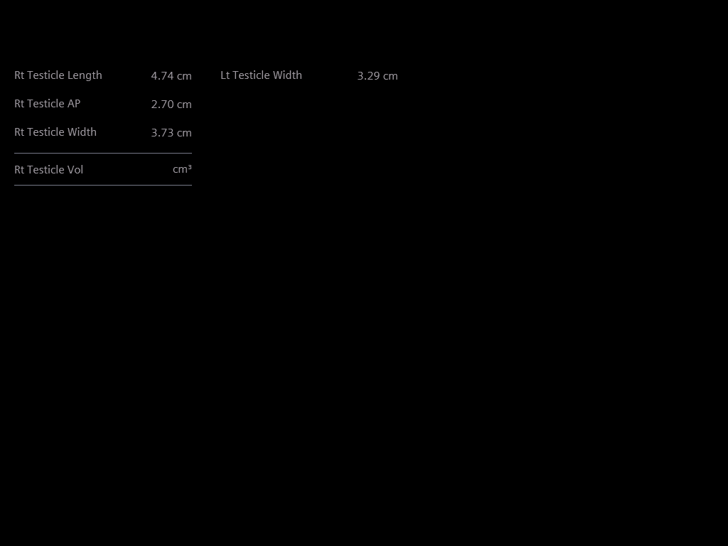

[14 of 25 positions shown; findings below may reference images not displayed]

FINDINGS: Right testicle

Measurements: 5.1 x 2.4 x 3.7 cm. No mass or microlithiasis
visualized.

Left testicle

Measurements: 4.7 x 2.7 x 3.3 cm. No mass or microlithiasis
visualized.

Right epididymis:  Normal in size and appearance.

Left epididymis:  Normal in size and appearance.

Hydrocele:  Small bilateral, greater on the right.

Varicocele:  Small and bilateral.

Pulsed Doppler interrogation of both testes demonstrates normal low
resistance arterial and venous waveforms bilaterally.
IMPRESSION: No acute abnormality.

Small bilateral varicoceles.

Very small bilateral hydroceles, greater on the left.

## 2022-01-14 ENCOUNTER — Ambulatory Visit (INDEPENDENT_AMBULATORY_CARE_PROVIDER_SITE_OTHER): Payer: BC Managed Care – PPO | Admitting: Family Medicine

## 2022-01-14 VITALS — BP 128/84 | HR 85 | Temp 98.0°F | Wt 244.0 lb

## 2022-01-14 DIAGNOSIS — J209 Acute bronchitis, unspecified: Secondary | ICD-10-CM | POA: Insufficient documentation

## 2022-01-14 MED ORDER — PREDNISONE 50 MG PO TABS: ORAL_TABLET | ORAL | 0 refills | Status: DC

## 2022-01-14 MED ORDER — AZITHROMYCIN 250 MG PO TABS: ORAL_TABLET | ORAL | 0 refills | Status: AC

## 2022-01-14 NOTE — Assessment & Plan Note (Signed)
Given persistent symptoms and lack of improvement, placing on azithromycin and prednisone.  Supportive care.

## 2022-01-14 NOTE — Progress Notes (Signed)
Subjective:  Patient ID: Miguel Hook., male    DOB: [birth year]  Age: 36 y.o. MRN: [id number]  CC: Chief Complaint  Patient presents with   Cough    Congestion since the Sunday after Thanksgiving    HPI:  36 year old male presents for evaluation of the above.  Patient reports that he has had respiratory symptoms since the Sunday after Thanksgiving.  Initially had sore throat and congestion.  Subsequently developed cough.  This is his primary complaint today.  Cough continued persist.  No shortness of breath.  No fever.  He has had multiple sick contacts.  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  Tobacco Use   Smoking status: Never   Smokeless tobacco: Never  Substance and Sexual Activity   Alcohol use: No   Drug use: No   Sexual activity: Not on file  Other Topics Concern   Not on file  Social History Narrative   Not on file   Social Determinants of Health   Financial Resource Strain: Not on file  Food Insecurity: Not on file  Transportation Needs: Not on file  Physical Activity: Not on file  Stress: Not on file  Social Connections: Not on file    Review of Systems Per HPI  Objective:  BP 128/84   Pulse 85   Temp 98 F (36.7 C) (Oral)   Wt 244 lb (110.7 kg)   SpO2 96%   BMI 35.01 kg/m      12 /12/2021   11:04 AM 08/22/2021    2:33 PM 05/29/2020    1:11 PM  BP/Weight  Systolic BP [id number]  Diastolic BP 84 62 73  Wt. (Lbs) 244 244.4 244  BMI 35.01 kg/m2 35.07 kg/m2 35.01 kg/m2    Physical Exam Vitals and nursing note reviewed.  Constitutional:       General: He is not in acute distress.    Appearance: Normal appearance.  HENT:     Head: Normocephalic and atraumatic.     Ears:     Comments: TMs with scarring.  No evidence of infection. Eyes:     General:        Right eye: No discharge.        Left eye: No discharge.     Conjunctiva/sclera: Conjunctivae normal.  Cardiovascular:     Rate and Rhythm: Normal rate and regular rhythm.  Pulmonary:     Effort: Pulmonary effort is normal.     Breath sounds: Normal breath sounds. No wheezing or rales.  Neurological:     Mental Status: He is alert.     Lab Results  Component Value Date   WBC 5.6 05/29/2020   HGB 14.4 05/29/2020   HCT 43.7 05/29/2020   PLT 267 05/29/2020   GLUCOSE 81 05/29/2020   CHOL 185 03/29/2014   TRIG 165 (H) 03/29/2014   HDL 46 03/29/2014   LDLCALC 106 (H) 03/29/2014   ALT 33 05/29/2020   AST 23 05/29/2020   NA 146 (H) 05/29/2020   K 4.1 05/29/2020  CL 108 (H) 05/29/2020   CREATININE 0.92 05/29/2020   BUN 16 05/29/2020   CO2 23 05/29/2020   TSH 2.270 03/07/2016     Assessment & Plan:   Problem List Items Addressed This Visit       Respiratory   Acute bronchitis - Primary    Given persistent symptoms and lack of improvement, placing on azithromycin and prednisone.  Supportive care.       Meds ordered this encounter  Medications   azithromycin (ZITHROMAX) 250 MG tablet    Sig: Take 2 tablets on day 1, then 1 tablet daily on days 2 through 5    Dispense:  6 tablet    Refill:  0   predniSONE (DELTASONE) 50 MG tablet    Sig: 1 tablet daily x 5 days    Dispense:  5 tablet    Refill:  0    Follow-up:  Return if symptoms worsen or fail to improve.  Everlene Other DO [facility]

## 2022-09-19 ENCOUNTER — Encounter: Payer: Self-pay | Admitting: Family Medicine

## 2023-03-11 ENCOUNTER — Telehealth: Payer: 59 | Admitting: Nurse Practitioner

## 2023-03-11 DIAGNOSIS — R6889 Other general symptoms and signs: Secondary | ICD-10-CM

## 2023-03-11 MED ORDER — ONDANSETRON 4 MG PO TBDP: 4.0000 mg | ORAL_TABLET | Freq: Three times a day (TID) | ORAL | 0 refills | Status: DC | PRN

## 2023-03-11 MED ORDER — OSELTAMIVIR PHOSPHATE 75 MG PO CAPS: 75.0000 mg | ORAL_CAPSULE | Freq: Two times a day (BID) | ORAL | 0 refills | Status: AC

## 2023-03-11 NOTE — Progress Notes (Signed)
 Virtual Visit Consent   Miguel Shenker., you are scheduled for a virtual visit with a [facility] provider today. Just as with appointments in the office, your consent must be obtained to participate. Your consent will be active for this visit and any virtual visit you may have with one of our providers in the next 365 days. If you have a MyChart account, a copy of this consent can be sent to you electronically.  As this is a virtual visit, video technology does not allow for your provider to perform a traditional examination. This may limit your provider's ability to fully assess your condition. If your provider identifies any concerns that need to be evaluated in person or the need to arrange testing (such as labs, EKG, etc.), we will make arrangements to do so. Although advances in technology are sophisticated, we cannot ensure that it will always work on either your end or our end. If the connection with a video visit is poor, the visit may have to be switched to a telephone visit. With either a video or telephone visit, we are not always able to ensure that we have a secure connection.  By engaging in this virtual visit, you consent to the provision of healthcare and authorize for your insurance to be billed (if applicable) for the services provided during this visit. Depending on your insurance coverage, you may receive a charge related to this service.  I need to obtain your verbal consent now. Are you willing to proceed with your visit today? Miguel ONEIDA Beverley Mickey. has provided verbal consent on 03/11/2023 for a virtual visit (video or telephone). Miguel Kitty, FNP  Date: 03/11/2023 7:33 PM  Virtual Visit via Video Note   I, Miguel Bowman, connected with  Miguel Welchel.  ([id number], April 09, 1985) on 03/11/23 at  7:45 PM EST by a video-enabled telemedicine application and verified that I am speaking with the correct person using two identifiers.  Location: Patient: Virtual Visit Location  Patient: Home Provider: Virtual Visit Location Provider: Home Office   I discussed the limitations of evaluation and management by telemedicine and the availability of in person appointments. The patient expressed understanding and agreed to proceed.    History of Present Illness: Miguel Matusek. is a 38 y.o. who identifies as a male who was assigned male at birth, and is being seen today for flu like symptoms   Symptom onset was last night  Symptoms include: sore throat, fatigue, chills, body aches, he has had waves of nausea and has had diarrhea  He was able to eat a banana   Has been using Dayquil for relief   He did take a COVID test and it was negative   Denies a history of asthma or need for  inhalers     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    Allergies:  Allergies  Allergen Reactions   Ceclor [Cefaclor]    Medications:  Current Outpatient Medications:    predniSONE  (DELTASONE ) 50 MG tablet, 1 tablet daily x 5 days, Disp: 5 tablet, Rfl: 0  Observations/Objective: Patient is well-developed, well-nourished in no acute distress.  Resting comfortably  at home.  Head is normocephalic, atraumatic.  No labored breathing.  Speech is clear and coherent with logical content.  Patient is alert and oriented at baseline.    Assessment and Plan:  1. Flu-like symptoms (Primary)  Push fluids:  Assure caloric intake with bland foods  Continue to manage symptoms with over the counter medications as directed   Meds ordered this encounter  Medications   oseltamivir  (TAMIFLU ) 75 MG capsule    Sig: Take 1 capsule (75 mg total) by mouth 2 (two) times daily for 5 days.    Dispense:  10 capsule    Refill:  0   ondansetron  (ZOFRAN -ODT) 4 MG disintegrating tablet    Sig: Take 1 tablet (4 mg total) by mouth every 8 (eight) hours as needed.    Dispense:  20 tablet     Refill:  0        Follow Up Instructions: I discussed the assessment and treatment plan with the patient. The patient was provided an opportunity to ask questions and all were answered. The patient agreed with the plan and demonstrated an understanding of the instructions.  A copy of instructions were sent to the patient via MyChart unless otherwise noted below.    The patient was advised to call back or seek an in-person evaluation if the symptoms worsen or if the condition fails to improve as anticipated.    Miguel Kitty, FNP

## 2023-03-16 ENCOUNTER — Telehealth: Payer: 59 | Admitting: Physician Assistant

## 2023-03-16 DIAGNOSIS — R051 Acute cough: Secondary | ICD-10-CM | POA: Diagnosis not present

## 2023-03-16 DIAGNOSIS — J209 Acute bronchitis, unspecified: Secondary | ICD-10-CM

## 2023-03-16 MED ORDER — AZITHROMYCIN 250 MG PO TABS: ORAL_TABLET | ORAL | 0 refills | Status: AC

## 2023-03-16 MED ORDER — PREDNISONE 20 MG PO TABS: 40.0000 mg | ORAL_TABLET | Freq: Every day | ORAL | 0 refills | Status: AC

## 2023-03-16 MED ORDER — ALBUTEROL SULFATE HFA 108 (90 BASE) MCG/ACT IN AERS: 2.0000 | INHALATION_SPRAY | Freq: Four times a day (QID) | RESPIRATORY_TRACT | 0 refills | Status: DC | PRN

## 2023-03-16 NOTE — Progress Notes (Signed)
 Virtual Visit Consent   Yahel Fuston., you are scheduled for a virtual visit with a [facility] provider today. Just as with appointments in the office, your consent must be obtained to participate. Your consent will be active for this visit and any virtual visit you may have with one of our providers in the next 365 days. If you have a MyChart account, a copy of this consent can be sent to you electronically.  As this is a virtual visit, video technology does not allow for your provider to perform a traditional examination. This may limit your provider's ability to fully assess your condition. If your provider identifies any concerns that need to be evaluated in person or the need to arrange testing (such as labs, EKG, etc.), we will make arrangements to do so. Although advances in technology are sophisticated, we cannot ensure that it will always work on either your end or our end. If the connection with a video visit is poor, the visit may have to be switched to a telephone visit. With either a video or telephone visit, we are not always able to ensure that we have a secure connection.  By engaging in this virtual visit, you consent to the provision of healthcare and authorize for your insurance to be billed (if applicable) for the services provided during this visit. Depending on your insurance coverage, you may receive a charge related to this service.  I need to obtain your verbal consent now. Are you willing to proceed with your visit today? Zachary ONEIDA Beverley Mickey. has provided verbal consent on 03/16/2023 for a virtual visit (video or telephone). Harlene PEDLAR Ward, PA-C  Date: 03/16/2023 10:23 AM  Virtual Visit via Video Note   I, Harlene PEDLAR Ward, connected with  Abigail Marsiglia.  ([id number], 1985/07/13) on 03/16/23 at 10:15 AM EST by a video-enabled telemedicine application and verified that I am speaking with the correct person using two identifiers.  Location: Patient: Virtual Visit  Location Patient: Home Provider: Virtual Visit Location Provider: Home Office   I discussed the limitations of evaluation and management by telemedicine and the availability of in person appointments. The patient expressed understanding and agreed to proceed.    History of Present Illness: Jemarion Roycroft. is a 38 y.o. who identifies as a male who was assigned male at birth, and is being seen today for continued wheezing and cough after getting over the flu.  Reports sx onset 03/11/2023.  Reports he is feeling much better.  He is taking Mucinex which provides some relief.  He reports the cough is very dry, with cough episodes feel wheezing in chest.  Denies fever.   HPI: HPI  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    Allergies:  Allergies  Allergen Reactions   Ceclor [Cefaclor]    Medications:  Current Outpatient Medications:    albuterol  (VENTOLIN  HFA) 108 (90 Base) MCG/ACT inhaler, Inhale 2 puffs into the lungs every 6 (six) hours as needed for wheezing or shortness of breath., Disp: 8 g, Rfl: 0   azithromycin  (ZITHROMAX ) 250 MG tablet, Take 2 tablets on day 1, then 1 tablet daily on days 2 through 5, Disp: 6 tablet, Rfl: 0   predniSONE  (DELTASONE ) 20 MG tablet, Take 2 tablets (40 mg total) by mouth daily with breakfast for 5 days., Disp: 10 tablet, Rfl: 0   ondansetron  (ZOFRAN -ODT) 4  MG disintegrating tablet, Take 1 tablet (4 mg total) by mouth every 8 (eight) hours as needed., Disp: 20 tablet, Rfl: 0   oseltamivir  (TAMIFLU ) 75 MG capsule, Take 1 capsule (75 mg total) by mouth 2 (two) times daily for 5 days., Disp: 10 capsule, Rfl: 0  Observations/Objective: Patient is well-developed, well-nourished in no acute distress.  Resting comfortably  at home.  Head is normocephalic, atraumatic.  No labored breathing.  Speech is clear and coherent with logical content.  Patient is  alert and oriented at baseline.    Assessment and Plan: 1. Acute cough (Primary)  Will send in albuterol  inhaler to use as needed.  Prescribed prednisone .  Due to patient travel will prescribe zpack to take on the trip and begin if sx become worse, return of fever.  Doubt patient has pneumonia at this time, but with upcoming international travel will provide antibiotic to take if he develops sx of pneumonia. Pt understands and agrees with plan.   Follow Up Instructions: I discussed the assessment and treatment plan with the patient. The patient was provided an opportunity to ask questions and all were answered. The patient agreed with the plan and demonstrated an understanding of the instructions.  A copy of instructions were sent to the patient via MyChart unless otherwise noted below.     The patient was advised to call back or seek an in-person evaluation if the symptoms worsen or if the condition fails to improve as anticipated.    Harlene PEDLAR Ward, PA-C

## 2023-03-16 NOTE — Patient Instructions (Signed)
 Miguel Bowman., thank you for joining Harlene PEDLAR Ward, PA-C for today's virtual visit.  While this provider is not your primary care provider (PCP), if your PCP is located in our provider database this encounter information will be shared with them immediately following your visit.   A [facility] MyChart account gives you access to today's visit and all your visits, tests, and labs performed at [facility]  click here if you don't have a [facility] MyChart account or go to [email or web address]  Consent: (Patient) Miguel Bowman. provided verbal consent for this virtual visit at the beginning of the encounter.  Current Medications:  Current Outpatient Medications:    albuterol  (VENTOLIN  HFA) 108 (90 Base) MCG/ACT inhaler, Inhale 2 puffs into the lungs every 6 (six) hours as needed for wheezing or shortness of breath., Disp: 8 g, Rfl: 0   azithromycin  (ZITHROMAX ) 250 MG tablet, Take 2 tablets on day 1, then 1 tablet daily on days 2 through 5, Disp: 6 tablet, Rfl: 0   predniSONE  (DELTASONE ) 20 MG tablet, Take 2 tablets (40 mg total) by mouth daily with breakfast for 5 days., Disp: 10 tablet, Rfl: 0   ondansetron  (ZOFRAN -ODT) 4 MG disintegrating tablet, Take 1 tablet (4 mg total) by mouth every 8 (eight) hours as needed., Disp: 20 tablet, Rfl: 0   oseltamivir  (TAMIFLU ) 75 MG capsule, Take 1 capsule (75 mg total) by mouth 2 (two) times daily for 5 days., Disp: 10 capsule, Rfl: 0   Medications ordered in this encounter:  Meds ordered this encounter  Medications   predniSONE  (DELTASONE ) 20 MG tablet    Sig: Take 2 tablets (40 mg total) by mouth daily with breakfast for 5 days.    Dispense:  10 tablet    Refill:  0    Supervising Provider:   LAMPTEY, PHILIP O [[id number]]   albuterol  (VENTOLIN  HFA) 108 (90 Base) MCG/ACT inhaler    Sig: Inhale 2 puffs into the lungs every 6 (six) hours as needed for wheezing or shortness of breath.    Dispense:  8 g    Refill:  0     Supervising Provider:   BLAISE ALEENE KIDD [[id number]]   azithromycin  (ZITHROMAX ) 250 MG tablet    Sig: Take 2 tablets on day 1, then 1 tablet daily on days 2 through 5    Dispense:  6 tablet    Refill:  0    Supervising Provider:   LAMPTEY, PHILIP O [id number]     *If you need refills on other medications prior to your next appointment, please contact your pharmacy*  Follow-Up: Call back or seek an in-person evaluation if the symptoms worsen or if the condition fails to improve as anticipated.  [facility] [phone]  Other Instructions Begin course of prednisone  and use inhaler as needed for wheezing.    If you develop worsening symptoms, fever, chills while traveling may begin antibiotic.    If you have been instructed to have an in-person evaluation today at a local Urgent Care facility, please use the link below. It will take you to a list of all of our available [facility], including address, phone number and hours of operation. Please do not delay care.  [facility]  If you or a family member do not have a primary care provider, use the link below to schedule a visit and establish care. When you choose a  primary care physician or advanced practice  provider, you gain a long-term partner in health. Find a Primary Care Provider  Learn more about [facility] in-office and virtual care options: [facility] - Get Care Now

## 2023-05-19 ENCOUNTER — Telehealth: Admitting: Physician Assistant

## 2023-05-19 DIAGNOSIS — A084 Viral intestinal infection, unspecified: Secondary | ICD-10-CM | POA: Diagnosis not present

## 2023-05-19 MED ORDER — ONDANSETRON 4 MG PO TBDP: 4.0000 mg | ORAL_TABLET | Freq: Three times a day (TID) | ORAL | 0 refills | Status: DC | PRN

## 2023-05-19 MED ORDER — DICYCLOMINE HCL 10 MG PO CAPS
10.0000 mg | ORAL_CAPSULE | Freq: Three times a day (TID) | ORAL | 0 refills | Status: DC
Start: 2023-05-19 — End: 2023-09-16

## 2023-05-19 NOTE — Progress Notes (Signed)
 Virtual Visit Consent   Miguel Bowman., you are scheduled for a virtual visit with a [facility] provider today. Just as with appointments in the office, your consent must be obtained to participate. Your consent will be active for this visit and any virtual visit you may have with one of our providers in the next 365 days. If you have a MyChart account, a copy of this consent can be sent to you electronically.  As this is a virtual visit, video technology does not allow for your provider to perform a traditional examination. This may limit your provider's ability to fully assess your condition. If your provider identifies any concerns that need to be evaluated in person or the need to arrange testing (such as labs, EKG, etc.), we will make arrangements to do so. Although advances in technology are sophisticated, we cannot ensure that it will always work on either your end or our end. If the connection with a video visit is poor, the visit may have to be switched to a telephone visit. With either a video or telephone visit, we are not always able to ensure that we have a secure connection.  By engaging in this virtual visit, you consent to the provision of healthcare and authorize for your insurance to be billed (if applicable) for the services provided during this visit. Depending on your insurance coverage, you may receive a charge related to this service.  I need to obtain your verbal consent now. Are you willing to proceed with your visit today? Miguel Bowman. has provided verbal consent on 05/19/2023 for a virtual visit (video or telephone). Angelia Kelp, PA-C  Date: 05/19/2023 8:19 AM   Virtual Visit via Video Note   I, Angelia Kelp, connected with  Miguel Bowman  ([id number], 20-Jun-1985) on 05/19/23 at  8:15 AM EDT by a video-enabled telemedicine application and verified that I am speaking with the correct person using two identifiers.  Location: Patient: Virtual  Visit Location Patient: Home Provider: Virtual Visit Location Provider: Home Office   I discussed the limitations of evaluation and management by telemedicine and the availability of in person appointments. The patient expressed understanding and agreed to proceed.    History of Present Illness: Miguel Tapp. is a 38 y.o. who identifies as a male who was assigned male at birth, and is being seen today for abd pain, nausea, diarrhea.  HPI: Abdominal Pain This is a new problem. The current episode started in the past 7 days (Friday, 05/16/23). The onset quality is sudden. The problem occurs intermittently (Started Friday with pain, nausea and diarrhea Saturday, Sunday improved with nausea and pain, but still having diarrhea). The problem has been unchanged. The pain is located in the RUQ. The pain is moderate. The quality of the pain is sharp (stabbing). The abdominal pain does not radiate. Associated symptoms include arthralgias, diarrhea, myalgias and nausea. Pertinent negatives include no fever or vomiting. Associated symptoms comments: Initially on Friday night had episode of feeling like all the muscles locked up and was stiff, had cold chills today, denies fever. The pain is aggravated by eating. The pain is relieved by Nothing. He has tried nothing for the symptoms. The treatment provided no relief.      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    Allergies:  Allergies  Allergen Reactions   Ceclor [Cefaclor]  Medications:  Current Outpatient Medications:    dicyclomine (BENTYL) 10 MG capsule, Take 1 capsule (10 mg total) by mouth 4 (four) times daily -  before meals and at bedtime., Disp: 40 capsule, Rfl: 0   ondansetron (ZOFRAN-ODT) 4 MG disintegrating tablet, Take 1-2 tablets (4-8 mg total) by mouth every 8 (eight) hours as needed., Disp: 20 tablet, Rfl: 0   albuterol  (VENTOLIN HFA) 108 (90 Base) MCG/ACT inhaler, Inhale 2 puffs into the lungs every 6 (six) hours as needed for wheezing or shortness of breath., Disp: 8 g, Rfl: 0  Observations/Objective: Patient is well-developed, well-nourished in no acute distress.  Resting comfortably at home.  Head is normocephalic, atraumatic.  No labored breathing.  Speech is clear and coherent with logical content.  Patient is alert and oriented at baseline.    Assessment and Plan: 1. Viral gastroenteritis (Primary) - ondansetron (ZOFRAN-ODT) 4 MG disintegrating tablet; Take 1-2 tablets (4-8 mg total) by mouth every 8 (eight) hours as needed.  Dispense: 20 tablet; Refill: 0 - dicyclomine (BENTYL) 10 MG capsule; Take 1 capsule (10 mg total) by mouth 4 (four) times daily -  before meals and at bedtime.  Dispense: 40 capsule; Refill: 0  - Suspect viral gastroenteritis - Zofran for nausea - Bentyl for cramping and diarrhea - Push fluids, electrolyte beverages - Liquid diet, then increase to soft/bland (BRAT) diet over next day, then increase diet as tolerated - Seek in person evaluation if not improving or symptoms worsen   Follow Up Instructions: I discussed the assessment and treatment plan with the patient. The patient was provided an opportunity to ask questions and all were answered. The patient agreed with the plan and demonstrated an understanding of the instructions.  A copy of instructions were sent to the patient via MyChart unless otherwise noted below.    The patient was advised to call back or seek an in-person evaluation if the symptoms worsen or if the condition fails to improve as anticipated.    Angelia Kelp, PA-C

## 2023-05-19 NOTE — Patient Instructions (Signed)
 [name], thank you for joining Angelia Kelp, PA-C for today's virtual visit.  While this provider is not your primary care provider (PCP), if your PCP is located in our provider database this encounter information will be shared with them immediately following your visit.   A [facility] MyChart account gives you access to today's visit and all your visits, tests, and labs performed at [facility] " click here if you don't have a [facility] MyChart account or go to [email or web address]  Consent: (Patient) Miguel Bowman. provided verbal consent for this virtual visit at the beginning of the encounter.  Current Medications:  Current Outpatient Medications:    dicyclomine (BENTYL) 10 MG capsule, Take 1 capsule (10 mg total) by mouth 4 (four) times daily -  before meals and at bedtime., Disp: 40 capsule, Rfl: 0   ondansetron (ZOFRAN-ODT) 4 MG disintegrating tablet, Take 1-2 tablets (4-8 mg total) by mouth every 8 (eight) hours as needed., Disp: 20 tablet, Rfl: 0   albuterol (VENTOLIN HFA) 108 (90 Base) MCG/ACT inhaler, Inhale 2 puffs into the lungs every 6 (six) hours as needed for wheezing or shortness of breath., Disp: 8 g, Rfl: 0   Medications ordered in this encounter:  Meds ordered this encounter  Medications   ondansetron (ZOFRAN-ODT) 4 MG disintegrating tablet    Sig: Take 1-2 tablets (4-8 mg total) by mouth every 8 (eight) hours as needed.    Dispense:  20 tablet    Refill:  0    Supervising Provider:   LAMPTEY, PHILIP O [[id number]]   dicyclomine (BENTYL) 10 MG capsule    Sig: Take 1 capsule (10 mg total) by mouth 4 (four) times daily -  before meals and at bedtime.    Dispense:  40 capsule    Refill:  0    Supervising Provider:   Corine Dice [[id number]]     *If you need refills on other medications prior to your next appointment, please contact your pharmacy*  Follow-Up: Call back or seek an in-person evaluation if the symptoms worsen  or if the condition fails to improve as anticipated.   Virtual Care [phone]  Other Instructions  Viral Gastroenteritis, Adult  Viral gastroenteritis is also known as the stomach flu. This condition may affect your stomach, small intestine, and large intestine. It can cause sudden watery diarrhea, fever, and vomiting. This condition is caused by many different viruses. These viruses can be passed from person to person very easily (are contagious). Diarrhea and vomiting can make you feel weak and cause you to become dehydrated. You may not be able to keep fluids down. Dehydration can make you tired and thirsty, cause you to have a dry mouth, and decrease how often you urinate. It is important to replace the fluids that you lose from diarrhea and vomiting. What are the causes? Gastroenteritis is caused by many viruses, including rotavirus and norovirus. Norovirus is the most common cause in adults. You can get sick after being exposed to the viruses from other people. You can also get sick by: Eating food, drinking water, or touching a surface contaminated with one of these viruses. Sharing utensils or other personal items with an infected person. What increases the risk? You are more likely to develop this condition if you: Have a weak body defense system (immune system). Live with one or more children who are younger than 2 years. Live in a nursing home. Travel on cruise ships. What are  the signs or symptoms? Symptoms of this condition start suddenly 1-3 days after exposure to a virus. Symptoms may last for a few days or for as long as a week. Common symptoms include watery diarrhea and vomiting. Other symptoms include: Fever. Headache. Fatigue. Pain in the abdomen. Chills. Weakness. Nausea. Muscle aches. Loss of appetite. How is this diagnosed? This condition is diagnosed with a medical history and physical exam. You may also have a stool test to check for viruses or  other infections. How is this treated? This condition typically goes away on its own. The focus of treatment is to prevent dehydration and restore lost fluids (rehydration). This condition may be treated with: An oral rehydration solution (ORS) to replace important salts and minerals (electrolytes) in your body. Take this if told by your health care provider. This is a drink that is sold at pharmacies and retail stores. Medicines to help with your symptoms. Probiotic supplements to reduce symptoms of diarrhea. Fluids given through an IV, if dehydration is severe. Older adults and people with other diseases or a weak immune system are at higher risk for dehydration. Follow these instructions at home: Eating and drinking  Take an ORS as told by your health care provider. Drink clear fluids in small amounts as you are able. Clear fluids include: Water. Ice chips. Diluted fruit juice. Low-calorie sports drinks. Drink enough fluid to keep your urine pale yellow. Eat small amounts of healthy foods every 3-4 hours as you are able. This may include whole grains, fruits, vegetables, lean meats, and yogurt. Avoid fluids that contain a lot of sugar or caffeine, such as energy drinks, sports drinks, and soda. Avoid spicy or fatty foods. Avoid alcohol. General instructions  Wash your hands often, especially after having diarrhea or vomiting. If soap and water are not available, use hand sanitizer. Make sure that all people in your household wash their hands well and often. Take over-the-counter and prescription medicines only as told by your health care provider. Rest at home while you recover. Watch your condition for any changes. Take a warm bath to relieve any burning or pain from frequent diarrhea episodes. Keep all follow-up visits. This is important. Contact a health care provider if you: Cannot keep fluids down. Have symptoms that get worse. Have new symptoms. Feel light-headed or  dizzy. Have muscle cramps. Get help right away if you: Have chest pain. Have trouble breathing or you are breathing very quickly. Have a fast heartbeat. Feel extremely weak or you faint. Have a severe headache, a stiff neck, or both. Have a rash. Have severe pain, cramping, or bloating in your abdomen. Have skin that feels cold and clammy. Feel confused. Have pain when you urinate. Have signs of dehydration, such as: Dark urine, very little urine, or no urine. Cracked lips. Dry mouth. Sunken eyes. Sleepiness. Weakness. Have signs of bleeding, such as: Seeing blood in your vomit. Having vomit that looks like coffee grounds. Having bloody or black stools or stools that look like tar. These symptoms may be an emergency. Get help right away. Call 911. Do not wait to see if the symptoms will go away. Do not drive yourself to the hospital. Summary Viral gastroenteritis is also known as the stomach flu. It can cause sudden watery diarrhea, fever, and vomiting. This condition can be passed from person to person very easily (is contagious). Take an oral rehydration solution (ORS) if told by your health care provider. This is a drink that is sold at pharmacies  and retail stores. Wash your hands often, especially after having diarrhea or vomiting. If soap and water are not available, use hand sanitizer. This information is not intended to replace advice given to you by your health care provider. Make sure you discuss any questions you have with your health care provider. Document Revised: 11/20/2020 Document Reviewed: 11/20/2020 Elsevier Patient Education  2024 Elsevier Inc.   Gallbladder Eating Plan High blood cholesterol, obesity, a sedentary lifestyle, an unhealthy diet, and diabetes are risk factors for developing gallstones. If you have a gallbladder condition, you may have trouble digesting fats and tolerating high fat intake. Eating a low-fat diet can help reduce your symptoms and  may be helpful before and after having surgery to remove your gallbladder (cholecystectomy). Your health care provider may recommend that you work with a dietitian to help you reduce the amount of fat in your diet. What are tips for following this plan? General guidelines Limit your fat intake to less than 30% of your total daily calories. If you eat around 1,800 calories each day, this means eating less than 60 grams (g) of fat per day. Fat is an important part of a healthy diet. Eating a low-fat diet can make it hard to maintain a healthy body weight. Ask your dietitian how much fat, calories, and other nutrients you need each day. Eat small, frequent meals throughout the day instead of three large meals. Drink at least 8-10 cups (1.9-2.4 L) of fluid a day. Drink enough fluid to keep your urine pale yellow. If you drink alcohol: Limit how much you have to: 0-1 drink a day for women who are not pregnant. 0-2 drinks a day for men. Know how much alcohol is in a drink. In the [place] one drink equals one 12 oz bottle of beer (355 mL), one 5 oz glass of wine (148 mL), or one 1 oz glass of hard liquor (44 mL). Reading food labels  Check nutrition facts on food labels for the amount of fat per serving. Choose foods with less than 3 grams of fat per serving. Shopping Choose nonfat and low-fat healthy foods. Look for the words "nonfat," "low-fat," or "fat-free." Avoid buying processed or prepackaged foods. Cooking Cook using low-fat methods, such as baking, broiling, grilling, or boiling. Cook with small amounts of healthy fats, such as olive oil, grapeseed oil, canola oil, avocado oil, or sunflower oil. What foods are recommended?  All fresh, frozen, or canned fruits and vegetables. Whole grains. Low-fat or nonfat (skim) milk and yogurt. Lean meat, skinless poultry, fish, eggs, and beans. Low-fat protein supplement powders or drinks. Spices and herbs. The items listed above may not be a  complete list of foods and beverages you can eat and drink. Contact a dietitian for more information. What foods are not recommended? High-fat foods. These include baked goods, fast food, fatty cuts of meat, ice cream, french toast, sweet rolls, pizza, cheese bread, foods covered with butter, creamy sauces, or cheese. Fried foods. These include french fries, tempura, battered fish, breaded chicken, fried breads, and sweets. Foods that cause bloating and gas. The items listed above may not be a complete list of foods that you should avoid. Contact a dietitian for more information. Summary A low-fat diet can be helpful if you have a gallbladder condition, or before and after gallbladder surgery. Limit your fat intake to less than 30% of your total daily calories. This is about 60 g of fat if you eat 1,800 calories each day. Eat small,  frequent meals throughout the day instead of three large meals. This information is not intended to replace advice given to you by your health care provider. Make sure you discuss any questions you have with your health care provider. Document Revised: 01/05/2021 Document Reviewed: 01/05/2021 Elsevier Patient Education  2024 Elsevier Inc.   If you have been instructed to have an in-person evaluation today at a local Urgent Care facility, please use the link below. It will take you to a list of all of our available [facility], including address, phone number and hours of operation. Please do not delay care.  [facility]  If you or a family member do not have a primary care provider, use the link below to schedule a visit and establish care. When you choose a [facility] primary care physician or advanced practice provider, you gain a long-term partner in health. Find a Primary Care Provider  Learn more about [facility] in-office and virtual care options: [facility] - Get Care Now

## 2023-08-22 ENCOUNTER — Encounter: Payer: Self-pay | Admitting: Advanced Practice Midwife

## 2023-09-16 ENCOUNTER — Ambulatory Visit (INDEPENDENT_AMBULATORY_CARE_PROVIDER_SITE_OTHER): Admitting: Family Medicine

## 2023-09-16 ENCOUNTER — Encounter: Payer: Self-pay | Admitting: Family Medicine

## 2023-09-16 VITALS — BP 124/80 | HR 79 | Temp 97.3°F | Ht 70.0 in | Wt 258.0 lb

## 2023-09-16 DIAGNOSIS — R21 Rash and other nonspecific skin eruption: Secondary | ICD-10-CM

## 2023-09-16 DIAGNOSIS — R41840 Attention and concentration deficit: Secondary | ICD-10-CM | POA: Diagnosis not present

## 2023-09-16 DIAGNOSIS — Z130 Encounter for screening for diseases of the blood and blood-forming organs and certain disorders involving the immune mechanism: Secondary | ICD-10-CM

## 2023-09-16 DIAGNOSIS — B351 Tinea unguium: Secondary | ICD-10-CM | POA: Diagnosis not present

## 2023-09-16 DIAGNOSIS — M722 Plantar fascial fibromatosis: Secondary | ICD-10-CM

## 2023-09-16 DIAGNOSIS — L989 Disorder of the skin and subcutaneous tissue, unspecified: Secondary | ICD-10-CM

## 2023-09-16 DIAGNOSIS — Z0000 Encounter for general adult medical examination without abnormal findings: Secondary | ICD-10-CM | POA: Insufficient documentation

## 2023-09-16 DIAGNOSIS — E669 Obesity, unspecified: Secondary | ICD-10-CM | POA: Diagnosis not present

## 2023-09-16 DIAGNOSIS — Z0001 Encounter for general adult medical examination with abnormal findings: Secondary | ICD-10-CM

## 2023-09-16 MED ORDER — NYSTATIN 100000 UNIT/GM EX POWD: 1.0000 | Freq: Three times a day (TID) | CUTANEOUS | 0 refills | Status: AC | PRN

## 2023-09-16 MED ORDER — MELOXICAM 15 MG PO TABS: 15.0000 mg | ORAL_TABLET | Freq: Every day | ORAL | 0 refills | Status: AC | PRN

## 2023-09-16 NOTE — Assessment & Plan Note (Signed)
 Screening labs.  Discussed preventative health care.

## 2023-09-16 NOTE — Assessment & Plan Note (Signed)
 Irritation in the groin and the axilla.  Nystatin  powder as directed.

## 2023-09-16 NOTE — Assessment & Plan Note (Signed)
 This is not particularly troublesome for him at this time.  Discussed treatment.  Patient elects to forego treatment at this time.

## 2023-09-16 NOTE — Assessment & Plan Note (Signed)
 Discussed dietary changes to promote weight loss.  Recommended nutrition referral.  Also discussed GLP-1 medications.  Patient amenable to nutrition referral.

## 2023-09-16 NOTE — Patient Instructions (Addendum)
 Ice, Medication, exercises for the plantar fasciitis.  Referral to nutrition placed.  Referral for assessment for ADD placed.   Fungal powder to the groin and axilla.

## 2023-09-16 NOTE — Assessment & Plan Note (Signed)
 Skin lesion on the left foot appears benign.  Advised to watch closely.

## 2023-09-16 NOTE — Assessment & Plan Note (Signed)
 Concern for attention deficit disorder.  Referring for psychological evaluation.

## 2023-09-16 NOTE — Assessment & Plan Note (Signed)
 Advised good supportive shoes.  Inserts if needed.  Rest, ice, exercises.  Meloxicam  as directed.

## 2023-09-16 NOTE — Progress Notes (Signed)
 Subjective:  Patient ID: Miguel ONEIDA Beverley Mickey., male    DOB: 1985/06/04  Age: 38 y.o. MRN: [id number]  CC:   Chief Complaint  Patient presents with   Annual Exam    HPI:  38 year old male presents for an annual exam.  He has multiple complaints and concerns at this time.  In regard to his preventative health care, patient would like to know whether COVID-19 vaccines and flu vaccines are currently available.  Advised to get these if desired a little later in the season.  Patient has multiple issues and concerns.  Patient concerned about his weight.  He would like to discuss interventions regarding weight loss. Patient reports a skin lesion to the dorsum of his left foot.  He would like me to examine this today. Patient reports that he is having issues with toenail fungus and would like to discuss this as well.  He also feels that he may have athlete's foot. Patient reports ongoing issues with right heel pain.  Worse with activity.  Seems to be better with rest.  Pain seems to radiate proximally. Also reports irritation in the axilla and in the groin. Additionally, he reports that for many years he has had ongoing trouble focusing.  Seems to be worse when he has a lot going on.  Feels like he is in a fog.  Has trouble concentrating.  Does not carry a diagnosis of ADHD.  He states that this has been worse as of late and even occurring in times where he is not busy or stressed.  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  Tobacco Use   Smoking status: Never   Smokeless tobacco: Never  Substance and Sexual Activity   Alcohol use: No   Drug use: No   Sexual activity: Not on file  Other Topics Concern   Not on  file  Social History Narrative   ** Merged History Encounter **       Social Drivers of Health   Financial Resource Strain: Low Risk  (09/15/2023)   Overall Financial Resource Strain (CARDIA)    Difficulty of Paying Living Expenses: Not hard at all  Food Insecurity: No Food Insecurity (09/15/2023)   Hunger Vital Sign    Worried About Running Out of Food in the Last Year: Never true    Ran Out of Food in the Last Year: Never true  Transportation Needs: No Transportation Needs (09/15/2023)   PRAPARE - Administrator, Civil Service (Medical): No    Lack of Transportation (Non-Medical): No  Physical Activity: Insufficiently Active (09/15/2023)   Exercise Vital Sign    Days of Exercise per Week: 4 days    Minutes of Exercise per Session: 30 min  Stress: Stress Concern Present (09/15/2023)   [employer] of Occupational Health - Occupational Stress Questionnaire    Feeling of Stress: To some extent  Social Connections: Moderately Isolated (09/15/2023)   Social Connection and Isolation Panel    Frequency of Communication with Friends and Family: Once a week    Frequency of Social Gatherings with Friends and Family: Once a week    Attends Religious Services: Never  Active Member of Clubs or Organizations: Yes    Attends Banker Meetings: More than 4 times per year    Marital Status: Married    Review of Systems Per HPI  Objective:  BP 124/80   Pulse 79   Temp (!) 97.3 F (36.3 C)   Ht 5' 10 (1.778 m)   Wt 258 lb (117 kg)   SpO2 98%   BMI 37.02 kg/m      09/16/2023   10:58 AM 01/14/2022   11:04 AM 08/22/2021    2:33 PM  BP/Weight  Systolic BP 124 128 112  Diastolic BP 80 84 62  Wt. (Lbs) 258 244 244.4  BMI 37.02 kg/m2 35.01 kg/m2 35.07 kg/m2    Physical Exam Vitals and nursing note reviewed.  Constitutional:      Appearance: Normal appearance. He is obese.  HENT:     Head: Normocephalic and atraumatic.  Eyes:     General:         Right eye: No discharge.        Left eye: No discharge.     Conjunctiva/sclera: Conjunctivae normal.  Cardiovascular:     Rate and Rhythm: Normal rate and regular rhythm.  Pulmonary:     Effort: Pulmonary effort is normal.     Breath sounds: Normal breath sounds. No wheezing or rales.  Musculoskeletal:     Comments: Tenderness to palpation on the right heel at the attachment site of the plantar fascia.  This is consistent with plantar fasciitis.  Feet:     Comments: Significantly dry and hyperkeratotic skin on the dorsum of the right foot.  Onychomycosis noted.  No significant athlete's foot identified. Skin:    Comments: Small benign-appearing mole to the dorsum of the left foot.  No features concerning for malignancy at this time.  Left axilla with mild erythema/irritation.  Neurological:     Mental Status: He is alert.  Psychiatric:        Mood and Affect: Mood normal.        Behavior: Behavior normal.     Lab Results  Component Value Date   WBC 5.6 05/29/2020   HGB 14.4 05/29/2020   HCT 43.7 05/29/2020   PLT 267 05/29/2020   GLUCOSE 81 05/29/2020   CHOL 185 03/29/2014   TRIG 165 (H) 03/29/2014   HDL 46 03/29/2014   LDLCALC 106 (H) 03/29/2014   ALT 33 05/29/2020   AST 23 05/29/2020   NA 146 (H) 05/29/2020   K 4.1 05/29/2020   CL 108 (H) 05/29/2020   CREATININE 0.92 05/29/2020   BUN 16 05/29/2020   CO2 23 05/29/2020   TSH 2.270 03/07/2016     Assessment & Plan:  Annual physical exam Assessment & Plan: Screening labs.  Discussed preventative health care.   Screening for deficiency anemia -     CBC  Obesity (BMI 30-39.9) Assessment & Plan: Discussed dietary changes to promote weight loss.  Recommended nutrition referral.  Also discussed GLP-1 medications.  Patient amenable to nutrition referral.  Orders: -     CMP14+EGFR -     Lipid panel -     Amb ref to Medical Nutrition Therapy-MNT  Attention deficit Assessment & Plan: Concern for attention  deficit disorder.  Referring for psychological evaluation.  Orders: -     Ambulatory referral to Psychology  Onychomycosis Assessment & Plan: This is not particularly troublesome for him at this time.  Discussed treatment.  Patient elects to forego treatment at this  time.   Plantar fasciitis Assessment & Plan: Advised good supportive shoes.  Inserts if needed.  Rest, ice, exercises.  Meloxicam  as directed.   Rash Assessment & Plan: Irritation in the groin and the axilla.  Nystatin  powder as directed.   Skin lesion Assessment & Plan: Skin lesion on the left foot appears benign.  Advised to watch closely.    Other orders -     Meloxicam ; Take 1 tablet (15 mg total) by mouth daily as needed for pain.  Dispense: 30 tablet; Refill: 0 -     Nystatin ; Apply 1 Application topically 3 (three) times daily as needed.  Dispense: 60 g; Refill: 0   Abhay Godbolt DO [facility]

## 2023-10-08 ENCOUNTER — Encounter: Attending: Family Medicine | Admitting: Nutrition

## 2023-10-08 ENCOUNTER — Encounter: Payer: Self-pay | Admitting: Nutrition

## 2023-10-08 VITALS — Ht 70.5 in | Wt 258.0 lb

## 2023-10-08 DIAGNOSIS — E669 Obesity, unspecified: Secondary | ICD-10-CM | POA: Diagnosis present

## 2023-10-08 NOTE — Progress Notes (Signed)
 Medical Nutrition Therapy  Appointment Start time:  1345  Appointment End time: 1445 Primary concerns today: Obesity  Referral diagnosis: E66.9 Preferred learning style: Auditory  Learning readiness: Ready   NUTRITION ASSESSMENT  38 yr old wmale referred for obesity. BMI 36 Lowest weight is 220 lbs 10 yrs ago. Desires to weigh about 230-240's. Has done Whole Plant 30 and lost about 30 lbs before.  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 PCP Dr. Bluford.  He is willing to make some lifestyle changes with diet, exercise, meal planning to improve his food choices and his health. Clinical Medical Hx: No past medical history on file.  Medications:  Current Outpatient Medications on File Prior to Visit  Medication Sig Dispense Refill   meloxicam  (MOBIC ) 15 MG tablet Take 1 tablet (15 mg total) by mouth daily as needed for pain. 30 tablet 0   nystatin  powder Apply 1 Application topically 3 (three) times daily as needed. 60 g 0   No current facility-administered medications on file prior to visit.    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 Cooks a home. Admits larger servings and not always healhty sides. Drinks water  Estimated Energy Needs Calories: 1800-2000 Carbohydrate: 200g Protein: 135g Fat: 50g   NUTRITION DIAGNOSIS  NI-1.7 Predicted excessive energy intake As related to hihg calorie diet.  As evidenced by BMI 36.   NUTRITION INTERVENTION  Nutrition education (E-1) on the following topics:  Lifestyle Medicine  - Whole Food, Plant Predominant Nutrition is highly recommended: Eat Plenty of vegetables, Mushrooms,  fruits, Legumes, Whole Grains, Nuts, seeds in lieu of processed meats, processed snacks/pastries red meat, poultry, eggs.    -It is better to avoid simple carbohydrates including: Cakes, Sweet Desserts, Ice Cream, Soda (diet and regular), Sweet Tea, Candies, Chips, Cookies, Store Bought Juices, Alcohol in Excess of  1-2 drinks a day, Lemonade,  Artificial Sweeteners, Doughnuts, Coffee Creamers, Sugar-free Products, etc, etc.  This is not a complete list.....  Exercise: If you are able: 30 -60 minutes a day ,4 days a week, or 150 minutes a week.  The longer the better.  Combine stretch, strength, and aerobic activities.  If you were told in the past that you have high risk for cardiovascular diseases, you may seek evaluation by your heart doctor prior to initiating moderate to intense exercise programs.   Handouts Provided Include  Lifestyle Medicine handouts  Learning Style & Readiness for Change Teaching method utilized: Visual & Auditory  Demonstrated degree of understanding via: Teach Back  Barriers to learning/adherence to lifestyle change: none  Goals Established by Pt Eat 3 balanced meals per day at times discussed Focus on more whole plant based foods of fruits, vegetables and whole grains instead of processed foods Drink a gallon of water per day Start measuring portions Work on meal prepping Lose 2-3 lbs per month.   MONITORING & EVALUATION Dietary intake, weekly physical activity, and weight in 1 month.  Next Steps  Patient is to work on meal prepping and meal planning.SABRA

## 2023-10-08 NOTE — Patient Instructions (Signed)
 Goals Established by Pt Eat 3 balanced meals per day at times discussed Focus on more whole plant based foods of fruits, vegetables and whole grains instead of processed foods Drink a gallon of water per day Start measuring portions Work on meal prepping Lose 2-3 lbs per month.

## 2023-10-09 ENCOUNTER — Ambulatory Visit: Payer: Self-pay | Admitting: Family Medicine

## 2023-10-09 LAB — LIPID PANEL
Chol/HDL Ratio: 4.5 ratio (ref 0.0–5.0)
Cholesterol, Total: 181 mg/dL (ref 100–199)
HDL: 40 mg/dL (ref >39)
LDL Chol Calc (NIH): 115 mg/dL — ABNORMAL HIGH (ref 0–99)
Triglycerides: 145 mg/dL (ref 0–149)
VLDL Cholesterol Cal: 26 mg/dL (ref 5–40)

## 2023-10-09 LAB — CBC
Hematocrit: 48 % (ref 37.5–51.0)
Hemoglobin: 16 g/dL (ref 13.0–17.7)
MCH: 28.6 pg (ref 26.6–33.0)
MCHC: 33.3 g/dL (ref 31.5–35.7)
MCV: 86 fL (ref 79–97)
Platelets: 349 x10E3/uL (ref 150–450)
RBC: 5.59 x10E6/uL (ref 4.14–5.80)
RDW: 13.6 % (ref 11.6–15.4)
WBC: 6.5 x10E3/uL (ref 3.4–10.8)

## 2023-10-09 LAB — CMP14+EGFR
ALT: 27 IU/L (ref 0–44)
AST: 20 IU/L (ref 0–40)
Albumin: 4.9 g/dL (ref 4.1–5.1)
Alkaline Phosphatase: 67 IU/L (ref 44–121)
BUN/Creatinine Ratio: 22 — ABNORMAL HIGH (ref 9–20)
BUN: 22 mg/dL — ABNORMAL HIGH (ref 6–20)
Bilirubin Total: 0.6 mg/dL (ref 0.0–1.2)
CO2: 21 mmol/L (ref 20–29)
Calcium: 9.6 mg/dL (ref 8.7–10.2)
Chloride: 105 mmol/L (ref 96–106)
Creatinine, Ser: 1.02 mg/dL (ref 0.76–1.27)
Globulin, Total: 2.3 g/dL (ref 1.5–4.5)
Glucose: 85 mg/dL (ref 70–99)
Potassium: 4.5 mmol/L (ref 3.5–5.2)
Sodium: 142 mmol/L (ref 134–144)
Total Protein: 7.2 g/dL (ref 6.0–8.5)
eGFR: 97 mL/min/1.73 (ref >59)

## 2023-10-10 NOTE — Telephone Encounter (Signed)
-----   Message from Jacqulyn KANDICE Ahle sent at 10/09/2023  8:54 PM EDT ----- Mildly elevated LDL. Healthy diet and regular exercise.  BUN Mildly elevated. Ensure good hydration. ----- Message ----- From: Interface, [facility] Lab Results In Sent: 10/09/2023   5:38 AM EDT To: Jayce G Cook, DO

## 2023-10-10 NOTE — Telephone Encounter (Signed)
 Left message for patient to review mychart results and recommendations.  Ok for E2C2 to give patient results.

## 2023-11-12 ENCOUNTER — Ambulatory Visit: Attending: Family Medicine | Admitting: Nutrition

## 2023-11-12 ENCOUNTER — Encounter: Payer: Self-pay | Admitting: Nutrition

## 2023-11-12 VITALS — Ht 70.5 in | Wt 254.0 lb

## 2023-11-12 DIAGNOSIS — E669 Obesity, unspecified: Secondary | ICD-10-CM | POA: Insufficient documentation

## 2023-11-12 NOTE — Progress Notes (Signed)
 Medical Nutrition Therapy  Appointment Start time:  [phone]  Appointment End [phone] Primary concerns today: Obesity  Referral diagnosis: E66.9 Preferred learning style: Auditory  Learning readiness: Ready   NUTRITION ASSESSMENT Followup Has been meal planning and meal prepping. Feels better. Digestion is improving.Drinking more water Better consistent energy. Has eaten breakfast a few times. Lost 4 lbs. Clothes fitting better. Eating more fruits, vegetables and lean cuts of meat. Has cut out a lot of processed foods.  Goals set previously Eat 3 balanced meals per day at times discussed-still working on it.  Focus on more whole plant based foods of fruits, vegetables and whole grains instead of processed foods-improvement-done Drink a gallon of water per day- still working on it. Start measuring portions-done Work on [employer]- done Lose 2-3 lbs per [employer] Readings from Last 3 Encounters:  11/12/23 254 lb (115.2 kg)  10/08/23 258 lb (117 kg)  09/16/23 258 lb (117 kg)   Ht Readings from Last 3 Encounters:  11/12/23 5' 10.5 (1.791 m)  10/08/23 5' 10.5 (1.791 m)  09/16/23 5' 10 (1.778 m)   Body mass index is 35.93 kg/m. @BMIFA @ Facility age limit for growth %iles is 20 years. Facility age limit for growth %iles is 20 years.  Medications:  Current Outpatient Medications on File Prior to Visit  Medication Sig Dispense Refill   meloxicam  (MOBIC ) 15 MG tablet Take 1 tablet (15 mg total) by mouth daily as needed for pain. 30 tablet 0   nystatin  powder Apply 1 Application topically 3 (three) times daily as needed. 60 g 0   No current facility-administered medications on file prior to visit.    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 Cooks a home. Admits larger servings and not always healhty sides. Drinks water  Estimated Energy Needs Calories: 1800-2000 Carbohydrate: 200g Protein: 135g Fat: 50g   NUTRITION DIAGNOSIS  NI-1.7 Predicted excessive energy intake As related to hihg calorie diet.  As evidenced by BMI 36.   NUTRITION INTERVENTION  Nutrition education (E-1) on the following topics:  Lifestyle Medicine  - Whole Food, Plant Predominant Nutrition is highly recommended: Eat Plenty of vegetables, Mushrooms, fruits, Legumes, Whole Grains, Nuts, seeds in lieu of processed meats, processed snacks/pastries red meat, poultry, eggs.    -It is better to avoid simple carbohydrates including: Cakes, Sweet Desserts, Ice Cream, Soda (diet and regular), Sweet Tea, Candies, Chips, Cookies, Store Bought Juices, Alcohol in Excess of  1-2 drinks a day, Lemonade,  Artificial Sweeteners, Doughnuts, Coffee Creamers, Sugar-free Products, etc, etc.  This is not a complete list.....  Exercise: If you are able: 30 -60 minutes a day ,4 days a week, or 150 minutes a week.  The longer the better.  Combine stretch, strength, and aerobic activities.  If you were told in the past that you have high risk for cardiovascular diseases, you may seek evaluation by your heart doctor prior to initiating moderate to intense exercise programs.   Handouts Provided Include  Lifestyle Medicine handouts  Learning Style & Readiness for Change Teaching method utilized: Visual & Auditory  Demonstrated degree of understanding via: Teach Back  Barriers to learning/adherence  to lifestyle change: none  Goals Established by Pt Goals Keep up the great job!! Take inventory of food in pantry, freeze and fridge to plan meals around for the week to use what you already have. Aim for 2 stanly cups of water per day- 80 oz. Lose 1 lb per  week Keep working on eating breakfast.  MONITORING & EVALUATION Dietary intake, weekly physical activity, and weight in 1 month.  Next Steps  Patient is to work on meal prepping and meal planning.SABRA

## 2023-11-12 NOTE — Patient Instructions (Addendum)
 Goals Keep up the great job!! Take inventory of food in pantry, freeze and fridge to plan meals around for the week to use what you already have. Aim for 2 stanly cups of water per day- 80 oz. Lose 1 lb per week Keep working on eating breakfast.

## 2023-12-22 ENCOUNTER — Ambulatory Visit: Admitting: Nutrition
